# Patient Record
Sex: Male | Born: 1970
Health system: Southern US, Community
[De-identification: ages and names within clinical notes are randomized; demographics above are authoritative.]

## PROBLEM LIST (undated history)

## (undated) DIAGNOSIS — I10 Essential (primary) hypertension: Secondary | ICD-10-CM

## (undated) DIAGNOSIS — E7800 Pure hypercholesterolemia, unspecified: Secondary | ICD-10-CM

## (undated) HISTORY — PX: OTHER SURGICAL HISTORY: SHX169

---

## 2013-12-22 ENCOUNTER — Encounter (HOSPITAL_COMMUNITY): Payer: Self-pay | Admitting: *Deleted

## 2013-12-23 ENCOUNTER — Ambulatory Visit: Payer: Self-pay | Admitting: Orthopedic Surgery

## 2013-12-23 NOTE — H&P (Signed)
Jerome Olson is an 43 y.o. male.   Chief Complaint: back and left leg pain HPI: The patient reports low back symptoms which began 4 week(s) (3 days) ago following a specific injury. 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 Symptoms are reported to be located in the left low back and in the left lumbosacral area and Symptoms include pain and tingling. The pain radiates to the left groin, left buttock and left thigh. Symptoms are exacerbated by sitting and recumbency. Prior to being seen today the patient was previously evaluated by Dr. Park Breed with [facility]. Past evaluation has included x-ray of the lumbar spine and MRI of the lumbar spine. Past treatment has included nonsteroidal anti-inflammatory drugs, opioid analgesics, muscle relaxants, corticosteroids, activity modification and physical therapy. 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 Note for "Back pain": The patient was given light duty restrictions; however he has not been able to work due to no light duty available. NCM is Deliah Goody.  Past Medical History  Diagnosis Date  . Hypertension   . High cholesterol     Past Surgical History  Procedure Laterality Date  . Cyst removed from right knee  90's    No family history on file. Social History:  reports that he has quit smoking. He has never used smokeless tobacco. He reports that he drinks alcohol. He reports that he does not use illicit drugs.  Allergies: No Known Allergies   (Not in a hospital admission)  No results found for this or any previous visit (from the past 48 hour(s)). No results found.  Review of Systems  Constitutional: Negative.   HENT: Negative.   Eyes: Negative.   Respiratory: Negative.   Cardiovascular: Negative.   Gastrointestinal: Negative.   Genitourinary: Negative.   Musculoskeletal: Positive for back pain.  Skin: Negative.   Neurological: Positive for  sensory change and focal weakness.  Psychiatric/Behavioral: Negative.     There were no vitals taken for this visit. Physical Exam  Constitutional: He is oriented to person, place, and time. He appears well-developed and well-nourished. He appears distressed.  HENT:  Head: Normocephalic and atraumatic.  Eyes: Conjunctivae and EOM are normal. Pupils are equal, round, and reactive to light.  Neck: Normal range of motion. Neck supple.  Cardiovascular: Normal rate and regular rhythm.   Respiratory: Effort normal and breath sounds normal.  GI: Soft. Bowel sounds are normal.  Musculoskeletal:  On exam he is in severe distress.  He is sitting and leaning to the right.  Femoral stretch is positive.  Straight leg raise produces buttock and thigh pain.  He has 5-/5 quadriceps and hip flexor weakness on the left.  Decreased sensation L3 dermatome.  Global limited range of motion of the lumbar spine.    Lumbar spine exam reveals no evidence of soft tissue swelling, no evidence of soft tissue swelling or deformity or skin ecchymosis. On palpation there is no tenderness of the lumbar spine. No flank pain with percussion. The abdomen is soft and nontender. Nontender over the trochanters. No cellulitis or lymphadenopathy.   Motor is 5/5 including EHL, tibialis anterior, plantarflexion, and hamstrings. The patient is normoreflexic. There is no Babinski or clonus. The patient has good distal pulses. No DVT. No pain and normal range of motion without instability of the hips, knees and ankles.  Inspection of the cervical spine reveals a normal lordosis without evidence of paraspinous spasms or soft tissue swelling.  Nontender to palpation. Full flexion, full extension, full left and right lateral rotation. Extension combined with lateral flexion does not reproduce pain. Negative impingement sign, negative secondary impingement sign of the shoulders. Negative Tinel's at median and ulnar nerves at the elbow. Negative  carpal compression test at the wrists. Motor of the upper extremities is 5/5 including biceps, triceps, brachioradialis,wrist flexion, wrist extension, finger flexion, finger extension. Reflexes are normoreflexic. Sensory exam is intact to light touch. There is no Hoffmann sign. Nontender over the thoracic spine.  Neurological: He is alert and oriented to person, place, and time. He has normal reflexes.  Skin: Skin is warm and dry.  Psychiatric: He has a normal mood and affect.    Three view radiographs of the lumbar spine demonstrate multilevel lumbar spondylosis, no instability in flexion and extension.  These are outside films.  Hips are unremarkable.    Reviewed outside medical records.  He is almost five weeks status post his injury.  He is at work lifting and bending.    Outside MRI demonstrates large disc herniation at 2-3 central, extruded fragment, multifactorial severe stenosis.    Assessment/Plan L2-3 radiculopathy secondary to large disc herniation at 2-3 sequestered.  No change in bowel or bladder function.  Radicular pain at L3.   I had an extensive discussion with Jerome Olson concerning current pathology, relevant anatomy, and treatment options.  At this point in time given the neurocompressive lesion noted at L2-3 it would be wise to consider decompression at L2-3 centrally.  He does have some symptoms on the right, though given the now dimension of the canal at 2-3 I would recommend decompression centrally.  Discussed the risks and benefits of that in extensive detail.  In the interim I would recommend out of work, protective positioning for bending, avoid extension.  If he has any change in bowel or bladder function, fevers, chills, numbness into his groin or worsening he is to call, present to the emergency room acutely.  I do feel this is related to his injury.  We will try to get approval as soon as possible.  Discussed this separately in front of the patient with Deliah GoodyJennifer Burton  his case manager.  We will proceed with urgency.  We will get the approval for this.  I appreciate the kind referral.    I had an extensive discussion of the risks and benefits of the lumbar decompression with the patient including bleeding, infection, damage to neurovascular structures, epidural fibrosis, CSF leak requiring repair. We also discussed increase in pain, adjacent segment disease, recurrent disc herniation, need for future surgery including repeat decompression and/or fusion. We also discussed risks of postoperative hematoma, paralysis, anesthetic complications including DVT, PE, death, cardiopulmonary dysfunction. In addition, the perioperative and postoperative courses were discussed in detail including the rehabilitative time and return to functional activity and work. I provided the patient with an illustrated handout and utilized the appropriate surgical models.  Plan microlumbar decompression L2-3  BISSELL, JACLYN M. PA-C for Dr. Shelle IronBeane 12/23/2013, 7:09 AM

## 2013-12-25 ENCOUNTER — Ambulatory Visit (HOSPITAL_COMMUNITY): Payer: Worker's Compensation

## 2013-12-25 ENCOUNTER — Ambulatory Visit (HOSPITAL_COMMUNITY): Payer: Worker's Compensation | Admitting: Anesthesiology

## 2013-12-25 ENCOUNTER — Ambulatory Visit (HOSPITAL_COMMUNITY)
Admission: RE | Admit: 2013-12-25 | Discharge: 2013-12-28 | Disposition: A | Discharge status: Home / Self Care | Payer: Worker's Compensation | Source: Ambulatory Visit | Attending: Specialist | Admitting: Specialist

## 2013-12-25 ENCOUNTER — Encounter (HOSPITAL_COMMUNITY)
Admission: RE | Disposition: A | Discharge status: Home / Self Care | Payer: Self-pay | Source: Ambulatory Visit | Attending: Specialist

## 2013-12-25 ENCOUNTER — Encounter (HOSPITAL_COMMUNITY): Payer: Self-pay | Admitting: *Deleted

## 2013-12-25 ENCOUNTER — Encounter (HOSPITAL_COMMUNITY): Payer: Worker's Compensation | Admitting: Anesthesiology

## 2013-12-25 DIAGNOSIS — Y9269 Other specified industrial and construction area as the place of occurrence of the external cause: Secondary | ICD-10-CM | POA: Insufficient documentation

## 2013-12-25 DIAGNOSIS — Y990 Civilian activity done for income or pay: Secondary | ICD-10-CM | POA: Insufficient documentation

## 2013-12-25 DIAGNOSIS — E7800 Pure hypercholesterolemia, unspecified: Secondary | ICD-10-CM | POA: Insufficient documentation

## 2013-12-25 DIAGNOSIS — X500XXA Overexertion from strenuous movement or load, initial encounter: Secondary | ICD-10-CM | POA: Insufficient documentation

## 2013-12-25 DIAGNOSIS — I10 Essential (primary) hypertension: Secondary | ICD-10-CM | POA: Insufficient documentation

## 2013-12-25 DIAGNOSIS — M5126 Other intervertebral disc displacement, lumbar region: Secondary | ICD-10-CM | POA: Diagnosis present

## 2013-12-25 HISTORY — DX: Pure hypercholesterolemia, unspecified: E78.00

## 2013-12-25 HISTORY — DX: Essential (primary) hypertension: I10

## 2013-12-25 HISTORY — PX: LUMBAR LAMINECTOMY/DECOMPRESSION MICRODISCECTOMY: SHX5026

## 2013-12-25 LAB — CBC
HCT: 41.9 % (ref 39.0–52.0)
Hemoglobin: 14.5 g/dL (ref 13.0–17.0)
MCH: 32 pg (ref 26.0–34.0)
MCHC: 34.6 g/dL (ref 30.0–36.0)
MCV: 92.5 fL (ref 78.0–100.0)
Platelets: 255 K/uL (ref 150–400)
RBC: 4.53 MIL/uL (ref 4.22–5.81)
RDW: 12.4 % (ref 11.5–15.5)
WBC: 4.8 K/uL (ref 4.0–10.5)

## 2013-12-25 LAB — BASIC METABOLIC PANEL WITH GFR
Anion gap: 12 (ref 5–15)
BUN: 16 mg/dL (ref 6–23)
CO2: 24 meq/L (ref 19–32)
Calcium: 9.4 mg/dL (ref 8.4–10.5)
Chloride: 100 meq/L (ref 96–112)
Creatinine, Ser: 0.98 mg/dL (ref 0.50–1.35)
GFR calc Af Amer: >90 mL/min
GFR calc non Af Amer: >90 mL/min
Glucose, Bld: 104 mg/dL — ABNORMAL HIGH (ref 70–99)
Potassium: 5.5 meq/L — ABNORMAL HIGH (ref 3.7–5.3)
Sodium: 136 meq/L — ABNORMAL LOW (ref 137–147)

## 2013-12-25 LAB — SURGICAL PCR SCREEN
MRSA, PCR: NEGATIVE
Staphylococcus aureus: NEGATIVE

## 2013-12-25 SURGERY — LUMBAR LAMINECTOMY/DECOMPRESSION MICRODISCECTOMY 1 LEVEL
Anesthesia: General | Site: Back

## 2013-12-25 MED ORDER — MIDAZOLAM HCL 2 MG/2ML IJ SOLN
INTRAMUSCULAR | Status: AC
  Filled 2013-12-25: qty 2

## 2013-12-25 MED ORDER — SODIUM CHLORIDE 0.9 % IR SOLN
Status: DC | PRN
  Administered 2013-12-25: 16:00:00

## 2013-12-25 MED ORDER — SODIUM CHLORIDE 0.9 % IJ SOLN
3.0000 mL | Freq: Two times a day (BID) | INTRAMUSCULAR | Status: DC
  Administered 2013-12-26 – 2013-12-27 (×3): 3 mL via INTRAVENOUS

## 2013-12-25 MED ORDER — MUPIROCIN 2 % EX OINT
TOPICAL_OINTMENT | CUTANEOUS | Status: AC
  Filled 2013-12-25: qty 22

## 2013-12-25 MED ORDER — ONDANSETRON HCL 4 MG/2ML IJ SOLN
INTRAMUSCULAR | Status: AC
  Filled 2013-12-25: qty 2

## 2013-12-25 MED ORDER — GLYCOPYRROLATE 0.2 MG/ML IJ SOLN
INTRAMUSCULAR | Status: AC
  Filled 2013-12-25: qty 4

## 2013-12-25 MED ORDER — METHOCARBAMOL 500 MG PO TABS: 500.0000 mg | ORAL_TABLET | Freq: Three times a day (TID) | ORAL | Status: DC | PRN

## 2013-12-25 MED ORDER — GLYCOPYRROLATE 0.2 MG/ML IJ SOLN
INTRAMUSCULAR | Status: DC | PRN
  Administered 2013-12-25: .8 mg via INTRAVENOUS

## 2013-12-25 MED ORDER — CEFAZOLIN SODIUM-DEXTROSE 2-3 GM-% IV SOLR
2.0000 g | Freq: Three times a day (TID) | INTRAVENOUS | Status: AC
  Administered 2013-12-25 – 2013-12-26 (×3): 2 g via INTRAVENOUS
  Filled 2013-12-25 (×3): qty 50

## 2013-12-25 MED ORDER — FENTANYL CITRATE 0.05 MG/ML IJ SOLN
INTRAMUSCULAR | Status: AC
  Filled 2013-12-25: qty 5

## 2013-12-25 MED ORDER — LACTATED RINGERS IV SOLN
INTRAVENOUS | Status: DC
  Administered 2013-12-25: 1000 mL via INTRAVENOUS

## 2013-12-25 MED ORDER — ONDANSETRON HCL 4 MG/2ML IJ SOLN: 4.0000 mg | INTRAMUSCULAR | Status: DC | PRN

## 2013-12-25 MED ORDER — ONDANSETRON HCL 4 MG/2ML IJ SOLN
INTRAMUSCULAR | Status: DC | PRN
  Administered 2013-12-25: 4 mg via INTRAVENOUS

## 2013-12-25 MED ORDER — HYDROMORPHONE HCL PF 1 MG/ML IJ SOLN: 0.2500 mg | INTRAMUSCULAR | Status: DC | PRN

## 2013-12-25 MED ORDER — DOCUSATE SODIUM 100 MG PO CAPS: 100.0000 mg | ORAL_CAPSULE | Freq: Two times a day (BID) | ORAL | Status: DC | PRN

## 2013-12-25 MED ORDER — LISINOPRIL 20 MG PO TABS
20.0000 mg | ORAL_TABLET | Freq: Every day | ORAL | Status: DC
  Administered 2013-12-26 – 2013-12-28 (×3): 20 mg via ORAL
  Filled 2013-12-25 (×3): qty 1

## 2013-12-25 MED ORDER — NEOSTIGMINE METHYLSULFATE 10 MG/10ML IV SOLN
INTRAVENOUS | Status: DC | PRN
  Administered 2013-12-25: 5 mg via INTRAVENOUS

## 2013-12-25 MED ORDER — HYDROMORPHONE HCL PF 1 MG/ML IJ SOLN
0.5000 mg | INTRAMUSCULAR | Status: DC | PRN
  Administered 2013-12-25 – 2013-12-27 (×9): 1 mg via INTRAVENOUS
  Filled 2013-12-25 (×9): qty 1

## 2013-12-25 MED ORDER — THROMBIN 5000 UNITS EX SOLR
CUTANEOUS | Status: AC
  Filled 2013-12-25: qty 10000

## 2013-12-25 MED ORDER — LACTATED RINGERS IV SOLN
INTRAVENOUS | Status: DC | PRN
  Administered 2013-12-25 (×2): via INTRAVENOUS

## 2013-12-25 MED ORDER — MEPERIDINE HCL 50 MG/ML IJ SOLN: 6.2500 mg | INTRAMUSCULAR | Status: DC | PRN

## 2013-12-25 MED ORDER — SODIUM CHLORIDE 0.9 % IJ SOLN
3.0000 mL | INTRAMUSCULAR | Status: DC | PRN
  Administered 2013-12-27: 3 mL via INTRAVENOUS

## 2013-12-25 MED ORDER — ACETAMINOPHEN 650 MG RE SUPP: 650.0000 mg | RECTAL | Status: DC | PRN

## 2013-12-25 MED ORDER — LISINOPRIL-HYDROCHLOROTHIAZIDE 20-12.5 MG PO TABS: 1.0000 | ORAL_TABLET | Freq: Every day | ORAL | Status: DC

## 2013-12-25 MED ORDER — DOCUSATE SODIUM 100 MG PO CAPS
100.0000 mg | ORAL_CAPSULE | Freq: Two times a day (BID) | ORAL | Status: DC
  Administered 2013-12-25 – 2013-12-28 (×6): 100 mg via ORAL

## 2013-12-25 MED ORDER — MENTHOL 3 MG MT LOZG: 1.0000 | LOZENGE | OROMUCOSAL | Status: DC | PRN

## 2013-12-25 MED ORDER — BUPIVACAINE-EPINEPHRINE (PF) 0.5% -1:200000 IJ SOLN
INTRAMUSCULAR | Status: AC
  Filled 2013-12-25: qty 30

## 2013-12-25 MED ORDER — THROMBIN 5000 UNITS EX SOLR
OROMUCOSAL | Status: DC | PRN
  Administered 2013-12-25: 18:00:00

## 2013-12-25 MED ORDER — ROCURONIUM BROMIDE 100 MG/10ML IV SOLN
INTRAVENOUS | Status: AC
  Filled 2013-12-25: qty 1

## 2013-12-25 MED ORDER — PROMETHAZINE HCL 25 MG/ML IJ SOLN: 6.2500 mg | INTRAMUSCULAR | Status: DC | PRN

## 2013-12-25 MED ORDER — LACTATED RINGERS IV SOLN: INTRAVENOUS | Status: DC

## 2013-12-25 MED ORDER — ACETAMINOPHEN 325 MG PO TABS: 650.0000 mg | ORAL_TABLET | ORAL | Status: DC | PRN

## 2013-12-25 MED ORDER — MIDAZOLAM HCL 5 MG/5ML IJ SOLN
INTRAMUSCULAR | Status: DC | PRN
  Administered 2013-12-25: 2 mg via INTRAVENOUS

## 2013-12-25 MED ORDER — SODIUM CHLORIDE 0.9 % IV SOLN: 250.0000 mL | INTRAVENOUS | Status: DC

## 2013-12-25 MED ORDER — HYDROCHLOROTHIAZIDE 12.5 MG PO CAPS
12.5000 mg | ORAL_CAPSULE | Freq: Every day | ORAL | Status: DC
  Administered 2013-12-26 – 2013-12-28 (×3): 12.5 mg via ORAL
  Filled 2013-12-25 (×3): qty 1

## 2013-12-25 MED ORDER — PROPOFOL 10 MG/ML IV BOLUS
INTRAVENOUS | Status: AC
  Filled 2013-12-25: qty 20

## 2013-12-25 MED ORDER — PROPOFOL 10 MG/ML IV BOLUS
INTRAVENOUS | Status: DC | PRN
  Administered 2013-12-25: 200 mg via INTRAVENOUS

## 2013-12-25 MED ORDER — METHOCARBAMOL 500 MG PO TABS
500.0000 mg | ORAL_TABLET | Freq: Four times a day (QID) | ORAL | Status: DC | PRN
  Administered 2013-12-26 – 2013-12-28 (×8): 500 mg via ORAL
  Filled 2013-12-25 (×8): qty 1

## 2013-12-25 MED ORDER — OXYCODONE-ACETAMINOPHEN 7.5-325 MG PO TABS: 1.0000 | ORAL_TABLET | ORAL | Status: DC | PRN

## 2013-12-25 MED ORDER — CEFAZOLIN SODIUM-DEXTROSE 2-3 GM-% IV SOLR
2.0000 g | INTRAVENOUS | Status: AC
  Administered 2013-12-25: 2 g via INTRAVENOUS

## 2013-12-25 MED ORDER — HEMOSTATIC AGENTS (NO CHARGE) OPTIME
TOPICAL | Status: DC | PRN
  Administered 2013-12-25: 10

## 2013-12-25 MED ORDER — HYDROCODONE-ACETAMINOPHEN 5-325 MG PO TABS
1.0000 | ORAL_TABLET | ORAL | Status: DC | PRN
  Administered 2013-12-25 – 2013-12-26 (×2): 2 via ORAL
  Filled 2013-12-25 (×2): qty 2

## 2013-12-25 MED ORDER — CEFAZOLIN SODIUM-DEXTROSE 2-3 GM-% IV SOLR
INTRAVENOUS | Status: AC
  Filled 2013-12-25: qty 50

## 2013-12-25 MED ORDER — BUPIVACAINE-EPINEPHRINE 0.5% -1:200000 IJ SOLN
INTRAMUSCULAR | Status: DC | PRN
  Administered 2013-12-25: 15 mL

## 2013-12-25 MED ORDER — METHOCARBAMOL 1000 MG/10ML IJ SOLN
500.0000 mg | Freq: Four times a day (QID) | INTRAVENOUS | Status: DC | PRN
  Administered 2013-12-25 – 2013-12-26 (×2): 500 mg via INTRAVENOUS
  Filled 2013-12-25 (×2): qty 5

## 2013-12-25 MED ORDER — KCL IN DEXTROSE-NACL 20-5-0.45 MEQ/L-%-% IV SOLN
INTRAVENOUS | Status: AC
  Administered 2013-12-25: 20:00:00 via INTRAVENOUS
  Filled 2013-12-25 (×2): qty 1000

## 2013-12-25 MED ORDER — OXYCODONE-ACETAMINOPHEN 5-325 MG PO TABS
1.0000 | ORAL_TABLET | ORAL | Status: DC | PRN
  Filled 2013-12-25 (×2): qty 2

## 2013-12-25 MED ORDER — SUCCINYLCHOLINE CHLORIDE 20 MG/ML IJ SOLN
INTRAMUSCULAR | Status: DC | PRN
  Administered 2013-12-25: 100 mg via INTRAVENOUS

## 2013-12-25 MED ORDER — HYDROMORPHONE HCL PF 2 MG/ML IJ SOLN
INTRAMUSCULAR | Status: AC
  Filled 2013-12-25: qty 1

## 2013-12-25 MED ORDER — PHENOL 1.4 % MT LIQD: 1.0000 | OROMUCOSAL | Status: DC | PRN

## 2013-12-25 MED ORDER — HYDROMORPHONE HCL PF 1 MG/ML IJ SOLN
INTRAMUSCULAR | Status: DC | PRN
  Administered 2013-12-25: 1 mg via INTRAVENOUS
  Administered 2013-12-25 (×2): 0.5 mg via INTRAVENOUS

## 2013-12-25 MED ORDER — MUPIROCIN 2 % EX OINT
1.0000 "application " | TOPICAL_OINTMENT | Freq: Once | CUTANEOUS | Status: AC
  Administered 2013-12-25: 1 via TOPICAL

## 2013-12-25 MED ORDER — FENTANYL CITRATE 0.05 MG/ML IJ SOLN
INTRAMUSCULAR | Status: DC | PRN
Start: 2013-12-25 — End: 2013-12-25
  Administered 2013-12-25: 100 ug via INTRAVENOUS
  Administered 2013-12-25: 50 ug via INTRAVENOUS
  Administered 2013-12-25: 100 ug via INTRAVENOUS

## 2013-12-25 MED ORDER — ROCURONIUM BROMIDE 100 MG/10ML IV SOLN
INTRAVENOUS | Status: DC | PRN
  Administered 2013-12-25: 10 mg via INTRAVENOUS
  Administered 2013-12-25: 30 mg via INTRAVENOUS
  Administered 2013-12-25: 10 mg via INTRAVENOUS
  Administered 2013-12-25 (×2): 5 mg via INTRAVENOUS

## 2013-12-25 MED ORDER — NEOSTIGMINE METHYLSULFATE 10 MG/10ML IV SOLN
INTRAVENOUS | Status: AC
  Filled 2013-12-25: qty 1

## 2013-12-25 SURGICAL SUPPLY — 45 items
BAG ZIPLOCK 12X15 (MISCELLANEOUS) IMPLANT
CLEANER TIP ELECTROSURG 2X2 (MISCELLANEOUS) ×2 IMPLANT
CLOTH 2% CHLOROHEXIDINE 3PK (PERSONAL CARE ITEMS) ×2 IMPLANT
DRAPE MICROSCOPE LEICA (MISCELLANEOUS) ×2 IMPLANT
DRAPE POUCH INSTRU U-SHP 10X18 (DRAPES) ×2 IMPLANT
DRAPE SURG 17X11 SM STRL (DRAPES) ×2 IMPLANT
DRAPE UTILITY XL STRL (DRAPES) ×2 IMPLANT
DRSG AQUACEL AG ADV 3.5X 4 (GAUZE/BANDAGES/DRESSINGS) IMPLANT
DRSG AQUACEL AG ADV 3.5X 6 (GAUZE/BANDAGES/DRESSINGS) ×2 IMPLANT
DURAPREP 26ML APPLICATOR (WOUND CARE) ×2 IMPLANT
DURASEAL SPINE SEALANT 3ML (MISCELLANEOUS) IMPLANT
ELECT BLADE TIP CTD 4 INCH (ELECTRODE) IMPLANT
ELECT REM PT RETURN 9FT ADLT (ELECTROSURGICAL) ×2
ELECTRODE REM PT RTRN 9FT ADLT (ELECTROSURGICAL) ×1 IMPLANT
FLOSEAL 10ML (HEMOSTASIS) ×2 IMPLANT
GLOVE BIOGEL PI IND STRL 7.5 (GLOVE) IMPLANT
GLOVE BIOGEL PI INDICATOR 7.5 (GLOVE)
GLOVE SURG SS PI 7.5 STRL IVOR (GLOVE) IMPLANT
GLOVE SURG SS PI 8.0 STRL IVOR (GLOVE) ×4 IMPLANT
GOWN STRL REUS W/TWL XL LVL3 (GOWN DISPOSABLE) ×4 IMPLANT
IV CATH 14GX2 1/4 (CATHETERS) ×2 IMPLANT
KIT BASIN OR (CUSTOM PROCEDURE TRAY) ×2 IMPLANT
KIT POSITIONING SURG ANDREWS (MISCELLANEOUS) ×2 IMPLANT
MANIFOLD NEPTUNE II (INSTRUMENTS) ×2 IMPLANT
NEEDLE SPNL 18GX3.5 QUINCKE PK (NEEDLE) ×4 IMPLANT
PATTIES SURGICAL .5 X.5 (GAUZE/BANDAGES/DRESSINGS) ×2 IMPLANT
PATTIES SURGICAL .75X.75 (GAUZE/BANDAGES/DRESSINGS) IMPLANT
PATTIES SURGICAL 1X1 (DISPOSABLE) IMPLANT
SPONGE LAP 4X18 X RAY DECT (DISPOSABLE) ×2 IMPLANT
SPONGE SURGIFOAM ABS GEL 100 (HEMOSTASIS) ×2 IMPLANT
STAPLER VISISTAT (STAPLE) IMPLANT
STRIP CLOSURE SKIN 1/2X4 (GAUZE/BANDAGES/DRESSINGS) ×2 IMPLANT
SUT NURALON 4 0 TR CR/8 (SUTURE) IMPLANT
SUT PROLENE 3 0 PS 2 (SUTURE) IMPLANT
SUT VIC AB 1 CT1 27 (SUTURE)
SUT VIC AB 1 CT1 27XBRD ANTBC (SUTURE) IMPLANT
SUT VIC AB 1-0 CT2 27 (SUTURE) ×2 IMPLANT
SUT VIC AB 2-0 CT1 27 (SUTURE)
SUT VIC AB 2-0 CT1 TAPERPNT 27 (SUTURE) IMPLANT
SUT VIC AB 2-0 CT2 27 (SUTURE) ×2 IMPLANT
SYRINGE 3CC LL L/F (MISCELLANEOUS) ×2 IMPLANT
TOWEL OR 17X26 10 PK STRL BLUE (TOWEL DISPOSABLE) ×2 IMPLANT
TOWEL OR NON WOVEN STRL DISP B (DISPOSABLE) IMPLANT
TRAY LAMINECTOMY (CUSTOM PROCEDURE TRAY) ×2 IMPLANT
YANKAUER SUCT BULB TIP NO VENT (SUCTIONS) ×2 IMPLANT

## 2013-12-25 NOTE — Anesthesia Preprocedure Evaluation (Addendum)
Anesthesia Evaluation  Patient identified by MRN, date of birth, ID band Patient awake    Reviewed: Allergy & Precautions, H&P , NPO status , Patient's Chart, lab work & pertinent test results  Airway Mallampati: II TM Distance: >3 FB Neck ROM: Full    Dental no notable dental hx. (+) Edentulous Upper   Pulmonary neg pulmonary ROS, former smoker,  breath sounds clear to auscultation  Pulmonary exam normal       Cardiovascular hypertension, Pt. on medications Rhythm:Regular Rate:Normal     Neuro/Psych negative neurological ROS  negative psych ROS   GI/Hepatic negative GI ROS, Neg liver ROS,   Endo/Other  negative endocrine ROS  Renal/GU negative Renal ROS  negative genitourinary   Musculoskeletal negative musculoskeletal ROS (+)   Abdominal   Peds negative pediatric ROS (+)  Hematology negative hematology ROS (+)   Anesthesia Other Findings   Reproductive/Obstetrics negative OB ROS                          Anesthesia Physical Anesthesia Plan  ASA: II  Anesthesia Plan: General   Post-op Pain Management:    Induction: Intravenous  Airway Management Planned: Oral ETT  Additional Equipment:   Intra-op Plan:   Post-operative Plan: Extubation in OR  Informed Consent: I have reviewed the patients History and Physical, chart, labs and discussed the procedure including the risks, benefits and alternatives for the proposed anesthesia with the patient or authorized representative who has indicated his/her understanding and acceptance.   Dental advisory given  Plan Discussed with: CRNA  Anesthesia Plan Comments:         Anesthesia Quick Evaluation

## 2013-12-25 NOTE — Anesthesia Postprocedure Evaluation (Signed)
  Anesthesia Post-op Note  Patient: Jerome MulliganBilly C Ahner  Procedure(s) Performed: Procedure(s) (LRB): MICRO LUMBAR DECOMPRESSION L2-L3 central (N/A)  Patient Location: PACU  Anesthesia Type: General  Level of Consciousness: awake and alert   Airway and Oxygen Therapy: Patient Spontanous Breathing  Post-op Pain: mild  Post-op Assessment: Post-op Vital signs reviewed, Patient's Cardiovascular Status Stable, Respiratory Function Stable, Patent Airway and No signs of Nausea or vomiting  Last Vitals:  Filed Vitals:   12/25/13 1930  BP:   Pulse:   Temp: 36.8 C  Resp:     Post-op Vital Signs: stable   Complications: No apparent anesthesia complications

## 2013-12-25 NOTE — Discharge Instructions (Signed)
Walk As Tolerated utilizing back precautions.  No bending, twisting, or lifting.  No driving for 2 weeks.   °Aquacel dressing may remain in place for 7 days. May shower with aquacel dressing in place. After 7 days, remove aquacel dressing and place gauze and tape dressing which should be kept clean and dry and changed daily. Do not remove steri-strips if they are present. °See Dr. Elodia Haviland in office in 10 to 14 days. Begin taking aspirin 81mg per day starting 4 days after your surgery if not allergic to aspirin or on another blood thinner. °Walk daily even outside. Use a cane or walker only if necessary. °Avoid sitting on soft sofas. ° °

## 2013-12-25 NOTE — H&P (View-Only) (Signed)
Jerome Olson is an 43 y.o. male.   Chief Complaint: back and left leg pain HPI: The patient reports low back symptoms which began 4 week(s) (3 days) ago following a specific injury. 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 Symptoms are reported to be located in the left low back and in the left lumbosacral area and Symptoms include pain and tingling. The pain radiates to the left groin, left buttock and left thigh. Symptoms are exacerbated by sitting and recumbency. Prior to being seen today the patient was previously evaluated by Dr. Park Breed with [facility]. Past evaluation has included x-ray of the lumbar spine and MRI of the lumbar spine. Past treatment has included nonsteroidal anti-inflammatory drugs, opioid analgesics, muscle relaxants, corticosteroids, activity modification and physical therapy. 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 Note for "Back pain": The patient was given light duty restrictions; however he has not been able to work due to no light duty available. NCM is Deliah Goody.  Past Medical History  Diagnosis Date  . Hypertension   . High cholesterol     Past Surgical History  Procedure Laterality Date  . Cyst removed from right knee  90's    No family history on file. Social History:  reports that he has quit smoking. He has never used smokeless tobacco. He reports that he drinks alcohol. He reports that he does not use illicit drugs.  Allergies: No Known Allergies   (Not in a hospital admission)  No results found for this or any previous visit (from the past 48 hour(s)). No results found.  Review of Systems  Constitutional: Negative.   HENT: Negative.   Eyes: Negative.   Respiratory: Negative.   Cardiovascular: Negative.   Gastrointestinal: Negative.   Genitourinary: Negative.   Musculoskeletal: Positive for back pain.  Skin: Negative.   Neurological: Positive for  sensory change and focal weakness.  Psychiatric/Behavioral: Negative.     There were no vitals taken for this visit. Physical Exam  Constitutional: He is oriented to person, place, and time. He appears well-developed and well-nourished. He appears distressed.  HENT:  Head: Normocephalic and atraumatic.  Eyes: Conjunctivae and EOM are normal. Pupils are equal, round, and reactive to light.  Neck: Normal range of motion. Neck supple.  Cardiovascular: Normal rate and regular rhythm.   Respiratory: Effort normal and breath sounds normal.  GI: Soft. Bowel sounds are normal.  Musculoskeletal:  On exam he is in severe distress.  He is sitting and leaning to the right.  Femoral stretch is positive.  Straight leg raise produces buttock and thigh pain.  He has 5-/5 quadriceps and hip flexor weakness on the left.  Decreased sensation L3 dermatome.  Global limited range of motion of the lumbar spine.    Lumbar spine exam reveals no evidence of soft tissue swelling, no evidence of soft tissue swelling or deformity or skin ecchymosis. On palpation there is no tenderness of the lumbar spine. No flank pain with percussion. The abdomen is soft and nontender. Nontender over the trochanters. No cellulitis or lymphadenopathy.   Motor is 5/5 including EHL, tibialis anterior, plantarflexion, and hamstrings. The patient is normoreflexic. There is no Babinski or clonus. The patient has good distal pulses. No DVT. No pain and normal range of motion without instability of the hips, knees and ankles.  Inspection of the cervical spine reveals a normal lordosis without evidence of paraspinous spasms or soft tissue swelling.  Nontender to palpation. Full flexion, full extension, full left and right lateral rotation. Extension combined with lateral flexion does not reproduce pain. Negative impingement sign, negative secondary impingement sign of the shoulders. Negative Tinel's at median and ulnar nerves at the elbow. Negative  carpal compression test at the wrists. Motor of the upper extremities is 5/5 including biceps, triceps, brachioradialis,wrist flexion, wrist extension, finger flexion, finger extension. Reflexes are normoreflexic. Sensory exam is intact to light touch. There is no Hoffmann sign. Nontender over the thoracic spine.  Neurological: He is alert and oriented to person, place, and time. He has normal reflexes.  Skin: Skin is warm and dry.  Psychiatric: He has a normal mood and affect.    Three view radiographs of the lumbar spine demonstrate multilevel lumbar spondylosis, no instability in flexion and extension.  These are outside films.  Hips are unremarkable.    Reviewed outside medical records.  He is almost five weeks status post his injury.  He is at work lifting and bending.    Outside MRI demonstrates large disc herniation at 2-3 central, extruded fragment, multifactorial severe stenosis.    Assessment/Plan L2-3 radiculopathy secondary to large disc herniation at 2-3 sequestered.  No change in bowel or bladder function.  Radicular pain at L3.   I had an extensive discussion with Mr. Jerome Olson concerning current pathology, relevant anatomy, and treatment options.  At this point in time given the neurocompressive lesion noted at L2-3 it would be wise to consider decompression at L2-3 centrally.  He does have some symptoms on the right, though given the now dimension of the canal at 2-3 I would recommend decompression centrally.  Discussed the risks and benefits of that in extensive detail.  In the interim I would recommend out of work, protective positioning for bending, avoid extension.  If he has any change in bowel or bladder function, fevers, chills, numbness into his groin or worsening he is to call, present to the emergency room acutely.  I do feel this is related to his injury.  We will try to get approval as soon as possible.  Discussed this separately in front of the patient with Deliah GoodyJennifer Burton  his case manager.  We will proceed with urgency.  We will get the approval for this.  I appreciate the kind referral.    I had an extensive discussion of the risks and benefits of the lumbar decompression with the patient including bleeding, infection, damage to neurovascular structures, epidural fibrosis, CSF leak requiring repair. We also discussed increase in pain, adjacent segment disease, recurrent disc herniation, need for future surgery including repeat decompression and/or fusion. We also discussed risks of postoperative hematoma, paralysis, anesthetic complications including DVT, PE, death, cardiopulmonary dysfunction. In addition, the perioperative and postoperative courses were discussed in detail including the rehabilitative time and return to functional activity and work. I provided the patient with an illustrated handout and utilized the appropriate surgical models.  Plan microlumbar decompression L2-3  Eliasar Hlavaty M. PA-C for Dr. Shelle IronBeane 12/23/2013, 7:09 AM

## 2013-12-25 NOTE — Progress Notes (Signed)
12/25/13 Nursing 2200   Patient has not voided yet..bladder scanned for 76 cc urine. Pt doen not want i/o cath. Will continue to monitor patient

## 2013-12-25 NOTE — Brief Op Note (Signed)
12/25/2013  6:11 PM  PATIENT:  Jerome MulliganBilly C Olson  43 y.o. male  PRE-OPERATIVE DIAGNOSIS:  HNP/STENOSIS  POST-OPERATIVE DIAGNOSIS:  HNP/STENOSIS  PROCEDURE:  Procedure(s): MICRO LUMBAR DECOMPRESSION L2-L3 central (N/A)  SURGEON:  Surgeon(s) and Role:    * Javier DockerJeffrey C Saydi Kobel, MD - Primary    * Drucilla SchmidtJames P Aplington, MD - Assisting  PHYSICIAN ASSISTANT:   ASSISTANTS: Aplington   ANESTHESIA:   general  EBL:  Total I/O In: 1000 [I.V.:1000] Out: 300 [Blood:300]  BLOOD ADMINISTERED:none  DRAINS: none   LOCAL MEDICATIONS USED:  MARCAINE     SPECIMEN:  Source of Specimen:  L23  DISPOSITION OF SPECIMEN:  PATHOLOGY  COUNTS:  YES  TOURNIQUET:  * No tourniquets in log *  DICTATION: .Other Dictation: Dictation Number    [id number]  PLAN OF CARE: Admit for overnight observation  PATIENT DISPOSITION:  PACU - hemodynamically stable.   Delay start of Pharmacological VTE agent (>24hrs) due to surgical blood loss or risk of bleeding: yes

## 2013-12-25 NOTE — Interval H&P Note (Signed)
History and Physical Interval Note:  12/25/2013 8:17 AM  Jerome Olson  has presented today for surgery, with the diagnosis of HNP/STENOSIS  The various methods of treatment have been discussed with the patient and family. After consideration of risks, benefits and other options for treatment, the patient has consented to  Procedure(s): MICRO LUMBAR DECOMPRESSION L2-3 (N/A) as a surgical intervention .  The patient's history has been reviewed, patient examined, no change in status, stable for surgery.  I have reviewed the patient's chart and labs.  Questions were answered to the patient's satisfaction.     Jelena Malicoat C

## 2013-12-25 NOTE — Transfer of Care (Signed)
Immediate Anesthesia Transfer of Care Note  Patient: Jerome MulliganBilly C Olson  Procedure(s) Performed: Procedure(s) (LRB): MICRO LUMBAR DECOMPRESSION L2-L3 central (N/A)  Patient Location: PACU  Anesthesia Type: General  Level of Consciousness: sedated, patient cooperative and responds to stimulation  Airway & Oxygen Therapy: Patient Spontanous Breathing and Patient connected to face mask oxgen  Post-op Assessment: Report given to PACU RN and Post -op Vital signs reviewed and stable  Post vital signs: Reviewed and stable  Complications: No apparent anesthesia complications

## 2013-12-26 ENCOUNTER — Encounter (HOSPITAL_COMMUNITY): Payer: Self-pay | Admitting: Specialist

## 2013-12-26 LAB — CBC
HCT: 37 % — ABNORMAL LOW (ref 39.0–52.0)
HEMOGLOBIN: 12.3 g/dL — AB (ref 13.0–17.0)
MCH: 31.9 pg (ref 26.0–34.0)
MCHC: 33.2 g/dL (ref 30.0–36.0)
MCV: 95.9 fL (ref 78.0–100.0)
Platelets: 210 10*3/uL (ref 150–400)
RBC: 3.86 MIL/uL — ABNORMAL LOW (ref 4.22–5.81)
RDW: 12.8 % (ref 11.5–15.5)
WBC: 6.3 10*3/uL (ref 4.0–10.5)

## 2013-12-26 LAB — BASIC METABOLIC PANEL
Anion gap: 12 (ref 5–15)
BUN: 12 mg/dL (ref 6–23)
CALCIUM: 8.9 mg/dL (ref 8.4–10.5)
CO2: 26 meq/L (ref 19–32)
Chloride: 102 mEq/L (ref 96–112)
Creatinine, Ser: 1.05 mg/dL (ref 0.50–1.35)
GFR calc Af Amer: >90 mL/min (ref >90)
GFR, EST NON AFRICAN AMERICAN: 84 mL/min — AB (ref >90)
Glucose, Bld: 132 mg/dL — ABNORMAL HIGH (ref 70–99)
Potassium: 4 mEq/L (ref 3.7–5.3)
Sodium: 140 mEq/L (ref 137–147)

## 2013-12-26 MED ORDER — OXYCODONE-ACETAMINOPHEN 5-325 MG PO TABS
1.0000 | ORAL_TABLET | ORAL | Status: DC | PRN
  Administered 2013-12-26 (×2): 2 via ORAL

## 2013-12-26 MED ORDER — HYDROMORPHONE HCL 2 MG PO TABS
2.0000 mg | ORAL_TABLET | ORAL | Status: DC | PRN
Start: 2013-12-26 — End: 2013-12-28
  Administered 2013-12-26 – 2013-12-28 (×11): 4 mg via ORAL
  Filled 2013-12-26 (×11): qty 2

## 2013-12-26 NOTE — Progress Notes (Signed)
Subjective: 1 Day Post-Op Procedure(s) (LRB): MICRO LUMBAR DECOMPRESSION L2-L3 central (N/A) Patient reports pain as 4 on 0-10 scale.   Legs feel better Voiding well. Objective: Vital signs in last 24 hours: Temp:  [97.5 F (36.4 C)-98.8 F (37.1 C)] 97.5 F (36.4 C) (08/18 0209) Pulse Rate:  [73-95] 73 (08/18 0209) Resp:  [16-24] 18 (08/18 0209) BP: (126-161)/(70-99) 126/77 mmHg (08/18 0209) SpO2:  [93 %-100 %] 98 % (08/18 0209) Weight:  [107.23 kg (236 lb 6.4 oz)] 107.23 kg (236 lb 6.4 oz) (08/17 1148)  Intake/Output from previous day: 08/17 0701 - 08/18 0700 In: 3405 [P.O.:480; I.V.:2825; IV Piggyback:100] Out: 1175 [Urine:875; Blood:300] Intake/Output this shift: Total I/O In: 1405 [P.O.:480; I.V.:825; IV Piggyback:100] Out: 875 [Urine:875]   Recent Labs  12/25/13 1218 12/26/13 0510  HGB 14.5 12.3*    Recent Labs  12/25/13 1218 12/26/13 0510  WBC 4.8 6.3  RBC 4.53 3.86*  HCT 41.9 37.0*  PLT 255 210    Recent Labs  12/25/13 1218 12/26/13 0510  NA 136* PENDING  K 5.5* PENDING  CL 100 PENDING  CO2 24 26  BUN 16 12  CREATININE 0.98 1.05  GLUCOSE 104* 132*  CALCIUM 9.4 8.9   No results found for this basename: LABPT, INR,  in the last 72 hours  Neurologically intact Sensation intact distally Dorsiflexion/Plantar flexion intact  Assessment/Plan: 1 Day Post-Op Procedure(s) (LRB): MICRO LUMBAR DECOMPRESSION L2-L3 central (N/A) Advance diet Up with therapy Plan for discharge tomorrow possibly today if does well in PT Discussed OR and prognosis.   Dorna Mallet C 12/26/2013, 6:06 AM

## 2013-12-26 NOTE — Op Note (Signed)
Jerome Olson, Jerome Olson               ACCOUNT NO.:  [id number]  MEDICAL RECORD NO.:  [id number]  LOCATION:  1608                         FACILITY:  [facility]  PHYSICIAN:  Jene Every, M.D.    DATE OF BIRTH:  09/22/1970  DATE OF PROCEDURE: DATE OF DISCHARGE:                              OPERATIVE REPORT   PREOPERATIVE DIAGNOSES:  Herniated nucleus pulposus, spinal stenosis at L2-3.  POSTOPERATIVE DIAGNOSES:  Herniated nucleus pulposus, spinal stenosis at L2-3.  PROCEDURES PERFORMED:  Lumbar decompression at L2-3 with bilateral hemilaminotomy and foraminotomies of L2 and L3.  Microdiskectomy, L2-3 bilaterally.  ANESTHESIA:  General.  ASSISTANT:  Jerome Olson, M.D.  BRIEF HISTORY:  This is a 43 year old with severe lower extremity radicular pain, L2-3 nerve root distribution secondary to large HNP compressing the thecal sac.  He had mild terminal weakness, dermatomal dysesthesias, indicated for lumbar decompression.  Risks and benefits were discussed including bleeding, infection, damage to neurovascular structures, DVT, PE, anesthetic complications, etc.  TECHNIQUE:  With the patient in supine position, after induction of adequate general anesthesia, 2 g Kefzol, placed prone on the Tatums frame.  All bony prominences were well padded.  Lumbar region was prepped and draped in usual sterile fashion.  Two 18-gauge spinal needle was utilized to localize 2-3 interspace, confirmed with x-ray.  Incision was made from above 2 to below 3.  Subcutaneous tissue was dissected. Electrocautery was utilized to achieve hemostasis.  Dorsolumbar fascia was identified and divided in line with the skin incision.  We infiltrated the paraspinous musculature with 0.25% Marcaine with epinephrine.  Paraspinous muscle was elevated from lamina 2 and 3. McCullough retractors were placed.  Confirmatory radiograph obtained. Operating microscope was draped and brought into the surgical field. Due to the  small interlaminar window at 2-3, we decided to proceed centrally and removed the interspinous ligament, and spinous process of 2 as the interlaminar window was beneath the disk space.  I used a 2 and a 3-mm Kerrison to perform hemilaminotomies bilaterally at the caudad edge of 2 preserving the pars.  We detached the ligamentum flavum from the cephalad edge of the 3 utilizing straight curette.  After detached the ligamentum flavum from the caudad edge of 2, we removed the ligamentum flavum centrally from the interspace.  Severe compression of the thecal sac was noted.  I meticulously worked from both sides of the operating field, decompressed the lateral recesses to the medial border of the pedicle.  So, there was no undue tension or compression of the thecal sac.  We performed foraminotomies of L2 and L3 bilaterally, identifying the nerve roots.  There was extensive epidural venous plexus and there was copious venous bleeding throughout the case.  We used hypertensive anesthesias with systolic blood pressure to 100.  We used thrombin-soaked Gelfoam and bone wax to control the epidural bleeding and bleeding from the bone.  We were able to mobilize the thecal sac from the right, gently medially, found a HNP.  We made a small incision in the extruded fragment and copious portion of disk material was removed.  Subligamentous and extruded with a micropituitary.  This was outside the disk space and within the disk space.  There  was no disk herniation within the foramen.  Following removal of the herniated material, we turned our attention to the left side.  The thecal sac was able to be better mobilize.  We performed an annulotomy and copious portion of disk material was removed from the disk space just beneath thecal sac.  Multiple fragments were retrieved, sent to Pathology.  This was after decompressing the lateral recesses to the medial border of the pedicle, just to be able to mobilize the  thecal sac.  Following this, there was good restoration of the thecal sac.  We irrigated the disk space bilaterally with catheter irrigation and also then entered the disk space bilaterally with a micropituitary.  Additional fragments were retrieved.  Irrigated once again.  We checked above the disk space and below the disk space.  We obtained confirmatory radiographs with a marker just above and below the disk space and neural probe passed freely up the foramen at 2 and 3 bilaterally.  Bipolar electrocautery was utilized to achieve hemostasis from an epidural venous plexus bilaterally.  The probe passed freely up the foramen at 2 and 3.  There was no compression upon the sac.  Good restoration.  There was some attenuation of the sac dorsally; however, no active bleeding or CSF leak and copiously irrigated the wound.  I used FloSeal during the case, let it sit for 2-3 minutes and then evacuated that for hemostatic control. I then placed thrombin-soaked Gelfoam in the laminotomy defect without active bleeding and removed the McCullough retractors, it irrigated copiously and the paraspinous tissues.  I then closed the fascia with 1 Vicryl, subcu with 2-0 and skin with staples.  Sterile dressing was applied.  Placed supine on the hospital bed, extubated without difficulty, and transported to the recovery room in satisfactory condition.  The patient tolerated the procedure well.  No complications.  Assistant, Jerome Olson.  Blood loss, 100 mL.     Jene EveryJeffrey Cleave Olson, M.D.     Jerome Olson/MEDQ  D:  12/25/2013  T:  12/26/2013  Job:  [id number]

## 2013-12-26 NOTE — Evaluation (Signed)
Physical Therapy Evaluation Patient Details Name: Jerome Olson MRN: [id number] DOB: February 14, 1971 Today's Date: 12/26/2013   History of Present Illness  Pt isa 43 year old male s/p L2-3 decompression  Clinical Impression  Pt admitted with lumbar decompression. Pt currently with functional limitations due to the deficits listed below (see PT Problem List).  Pt will benefit from skilled PT to increase their independence and safety with mobility to allow discharge to the venue listed below.  Pt with limited mobility due to back pain despite premedication.  Pt will need to practice a couple steps prior to d/c.     Follow Up Recommendations No PT follow up (f/u per f/u MD visit)    Equipment Recommendations  Rolling walker with 5" wheels    Recommendations for Other Services       Precautions / Restrictions Precautions Precautions: Back Restrictions Weight Bearing Restrictions: No      Mobility  Bed Mobility Overal bed mobility: Needs Assistance Bed Mobility: Rolling;Sidelying to Sit Rolling: Min assist Sidelying to sit: Mod assist   Sit to supine: Mod assist   General bed mobility comments: verbal cues for log roll technique, pt assisted slowly due to pain  Transfers Overall transfer level: Needs assistance Equipment used: Rolling walker (2 wheeled) Transfers: Sit to/from Stand Sit to Stand: Min assist;From elevated surface         General transfer comment: assist to rise and control descent, cues for back precautions  Ambulation/Gait Ambulation/Gait assistance: Min assist;+2 safety/equipment (recliner following due to increased pain) Ambulation Distance (Feet): 35 Feet Assistive device: Rolling walker (2 wheeled) Gait Pattern/deviations: Step-to pattern;Step-through pattern;Trunk flexed Gait velocity: decr   General Gait Details: step to pattern in room however with time pt able to progress to step through, verbal cues for straight back  Stairs             Wheelchair Mobility    Modified Rankin (Stroke Patients Only)       Balance                                             Pertinent Vitals/Pain Pain Assessment: 0-10 Pain Score: 9  Pain Location: back Pain Descriptors / Indicators: Constant;Sore Pain Intervention(s): Premedicated before session;Repositioned;Limited activity within patient's tolerance    Home Living Family/patient expects to be discharged to:: Private residence Living Arrangements: Spouse/significant other     Home Access: Stairs to enter Entrance Stairs-Rails: Right Entrance Stairs-Number of Steps: 2 Home Layout: Two level Home Equipment: None      Prior Function Level of Independence: Independent         Comments: pt was bent forward and able to complete adls.  Wife works at [employer] and is on [employer]        Extremity/Trunk Assessment   Upper Extremity Assessment:  (not assessed due to pain.  Can lift arms 30 degrees painfree)           Lower Extremity Assessment: LLE deficits/detail   LLE Deficits / Details: reports presurgical symptoms however states resolved, no numbness or tingling, ankle movement intact     Communication   Communication: No difficulties  Cognition Arousal/Alertness: Awake/alert Behavior During Therapy: WFL for tasks assessed/performed Overall Cognitive Status: Within Functional Limits for tasks assessed  General Comments      Exercises        Assessment/Plan    PT Assessment Patient needs continued PT services  PT Diagnosis Difficulty walking;Acute pain   PT Problem List Decreased mobility;Pain;Decreased knowledge of precautions;Decreased knowledge of use of DME  PT Treatment Interventions Gait training;DME instruction;Stair training;Functional mobility training;Therapeutic activities;Therapeutic exercise;Patient/family education   PT Goals (Current goals can be found in the Care  Plan section) Acute Rehab PT Goals Patient Stated Goal: get leg strength back and get rid of pain PT Goal Formulation: With patient Time For Goal Achievement: 12/30/13 Potential to Achieve Goals: Good    Frequency Min 5X/week   Barriers to discharge        Co-evaluation               End of Session   Activity Tolerance: Patient limited by pain Patient left: in chair;with call bell/phone within reach;with family/visitor present Nurse Communication: Mobility status    Functional Assessment Tool Used: clinical judgement Functional Limitation: Mobility: Walking and moving around Mobility: Walking and Moving Around Current Status ([id number](G8978): At least 40 percent but less than 60 percent impaired, limited or restricted Mobility: Walking and Moving Around Goal Status [phone](G8979): At least 1 percent but less than 20 percent impaired, limited or restricted    Time: [id number] PT Time Calculation (min): 16 min   Charges:   PT Evaluation $Initial PT Evaluation Tier I: 1 Procedure PT Treatments $Gait Training: 8-22 mins   PT G Codes:   Functional Assessment Tool Used: clinical judgement Functional Limitation: Mobility: Walking and moving around    MilfordLEMYRE,KATHrine E 12/26/2013, 2:54 PM Zenovia JarredKati Ishaq Maffei, PT, DPT 12/26/2013 Pager: [phone]

## 2013-12-26 NOTE — Evaluation (Signed)
Occupational Therapy Evaluation Patient Details Name: Jerome MulliganBilly C Blacketer MRN: [id number] DOB: [birth year] Today's Date: 12/26/2013    History of Present Illness pt is s/p L2-3 decompression   Clinical Impression   This 43 year old man was admitted for the above back surgery.  He was limited by pain and will benefit from skilled OT to educate on bathroom transfers and reinforce education.  His wife works at [employer] [place] and is very good about helping him.      Follow Up Recommendations  No OT follow up;Supervision/Assistance - 24 hour    Equipment Recommendations  3 in 1 bedside comode    Recommendations for Other Services       Precautions / Restrictions Precautions Precautions: Back Restrictions Weight Bearing Restrictions: No      Mobility Bed Mobility Overal bed mobility: + 2 for safety/equipment   Rolling: Min assist Sidelying to sit: +2 for safety/equipment;Mod assist   Sit to supine: Mod assist   General bed mobility comments: wife assisted with sidelying to sit.  Assistance for legs and trunk  Transfers Overall transfer level: Needs assistance Equipment used: Rolling walker (2 wheeled) Transfers: Sit to/from Stand Sit to Stand: Mod assist;From elevated surface         General transfer comment: assist to rise and steady.  Cues for back precautions    Balance                                            ADL Overall ADL's : Needs assistance/impaired     Grooming: Moderate assistance;Sitting   Upper Body Bathing: Moderate assistance;Sitting   Lower Body Bathing: Maximal assistance;Sit to/from stand   Upper Body Dressing : Moderate assistance;Sitting   Lower Body Dressing: Total assistance;Sit to/from stand       Toileting- ArchitectClothing Manipulation and Hygiene: Maximal assistance;Sit to/from stand         General ADL Comments: pt limited by pain.  Wife works at [employer] and will help as needed.  She is familiar with AE.      Vision                     Perception     Praxis      Pertinent Vitals/Pain Pain Assessment: 0-10 Pain Score: 8  Pain Location: back; groin Pain Intervention(s): Premedicated before session;Repositioned;Patient requesting pain meds-RN notified     Hand Dominance     Extremity/Trunk Assessment Upper Extremity Assessment Upper Extremity Assessment:  (not assessed due to pain.  Can lift arms 30 degrees painfree)           Communication Communication Communication: No difficulties   Cognition Arousal/Alertness: Awake/alert Behavior During Therapy: WFL for tasks assessed/performed Overall Cognitive Status: Within Functional Limits for tasks assessed                     General Comments       Exercises       Shoulder Instructions      Home Living Family/patient expects to be discharged to:: Private residence Living Arrangements: Spouse/significant other                 Bathroom Shower/Tub: Producer, television/film/videoWalk-in shower   Bathroom Toilet: Standard                Prior Functioning/Environment Level of Independence: Independent  Comments: pt was bent forward and able to complete adls.  Wife works at [employer] and is on [employer]    OT Diagnosis: Generalized weakness   OT Problem List: Pain;Decreased knowledge of use of DME or AE;Decreased knowledge of precautions;Decreased strength;Decreased activity tolerance   OT Treatment/Interventions: Self-care/ADL training;Patient/family education;DME and/or AE instruction    OT Goals(Current goals can be found in the care plan section) Acute Rehab OT Goals Patient Stated Goal: get leg strength back and get rid of pain OT Goal Formulation: With patient Time For Goal Achievement: 01/02/14 Potential to Achieve Goals: Good ADL Goals Pt Will Transfer to Toilet: with min assist;ambulating;bedside commode Pt Will Perform Tub/Shower Transfer: with min assist;Shower transfer;3 in 1 Additional ADL Goal  #1: pt will verbalize 3 back precautions and AE vs assistance  OT Frequency: Min 2X/week   Barriers to D/C:            Co-evaluation              End of Session Nurse Communication: Patient requests pain meds  Activity Tolerance: Patient limited by pain Patient left: in bed;with call bell/phone within reach;with family/visitor present   Time: 1102-1127 OT Time Calculation (min): 25 min Charges:  OT General Charges $OT Visit: 1 Procedure OT Evaluation $Initial OT Evaluation Tier I: 1 Procedure OT Treatments $Therapeutic Activity: 8-22 mins G-Codes: OT G-codes **NOT FOR INPATIENT CLASS** Functional Assessment Tool Used: clinical judgment Functional Limitation: Self care Self Care Current Status (Z6109): At least 80 percent but less than 100 percent impaired, limited or restricted Self Care Goal Status (U0454): At least 20 percent but less than 40 percent impaired, limited or restricted  Alizay Bronkema 12/26/2013, 12:05 PM  Marica Otter, OTR/L [phone] 12/26/2013

## 2013-12-27 MED ORDER — GABAPENTIN 300 MG PO CAPS: 300.0000 mg | ORAL_CAPSULE | Freq: Three times a day (TID) | ORAL | Status: DC

## 2013-12-27 MED ORDER — GABAPENTIN 300 MG PO CAPS
300.0000 mg | ORAL_CAPSULE | Freq: Three times a day (TID) | ORAL | Status: DC
  Administered 2013-12-27 – 2013-12-28 (×4): 300 mg via ORAL
  Filled 2013-12-27 (×6): qty 1

## 2013-12-27 MED ORDER — HYDROMORPHONE HCL 2 MG PO TABS: 2.0000 mg | ORAL_TABLET | ORAL | Status: DC | PRN

## 2013-12-27 MED ORDER — DEXAMETHASONE SODIUM PHOSPHATE 10 MG/ML IJ SOLN
10.0000 mg | Freq: Once | INTRAMUSCULAR | Status: AC
  Administered 2013-12-27: 10 mg via INTRAVENOUS
  Filled 2013-12-27: qty 1

## 2013-12-27 NOTE — Progress Notes (Signed)
A call to Deliah GoodyJennifer Burton [phone], pt's Worker's Comp for DME(RW, 3 in 1commode), no answer, left voice mail.

## 2013-12-27 NOTE — Progress Notes (Signed)
Subjective: 2 Days Post-Op Procedure(s) (LRB): MICRO LUMBAR DECOMPRESSION L2-L3 central (N/A) Patient reports pain as 4 on 0-10 scale.    Objective: Vital signs in last 24 hours: Temp:  [97.7 F (36.5 C)-100.1 F (37.8 C)] 100 F (37.8 C) (08/19 0447) Pulse Rate:  [76-100] 100 (08/19 0447) Resp:  [16-18] 18 (08/19 0447) BP: (110-130)/(68-80) 111/71 mmHg (08/19 0447) SpO2:  [90 %-100 %] 90 % (08/19 0447)  Intake/Output from previous day: 08/18 0701 - 08/19 0700 In: 483 [P.O.:480; I.V.:3] Out: 550 [Urine:550] Intake/Output this shift:     Recent Labs  12/25/13 1218 12/26/13 0510  HGB 14.5 12.3*    Recent Labs  12/25/13 1218 12/26/13 0510  WBC 4.8 6.3  RBC 4.53 3.86*  HCT 41.9 37.0*  PLT 255 210    Recent Labs  12/25/13 1218 12/26/13 0510  NA 136* 140  K 5.5* 4.0  CL 100 102  CO2 24 26  BUN 16 12  CREATININE 0.98 1.05  GLUCOSE 104* 132*  CALCIUM 9.4 8.9   No results found for this basename: LABPT, INR,  in the last 72 hours  Neurologically intact No flatus. Good BS   Assessment/Plan: 2 Days Post-Op Procedure(s) (LRB): MICRO LUMBAR DECOMPRESSION L2-L3 central (N/A) Left leg numbness last PM probable nerve root swelling. Needs flatus - Kpad IV decadron. Neurontin Kpad Up with therapy D/C IV fluids Plan for discharge tomorrow  Quante Pettry C 12/27/2013, 8:32 AM

## 2013-12-27 NOTE — Progress Notes (Signed)
Occupational Therapy Treatment Patient Details Name: Jerome MulliganBilly C Olson MRN: [id number] DOB: Jul 24, 1970 Today's Date: 12/27/2013    History of present illness Pt isa 43 year old male s/p L2-3 decompression   OT comments  Pt is making progress with OT.  Able to tolerate ambulating to bathroom.  Still mod A for bed mobility but less pain today than yesterday  Follow Up Recommendations  No OT follow up;Supervision/Assistance - 24 hour    Equipment Recommendations  3 in 1 bedside comode    Recommendations for Other Services      Precautions / Restrictions Precautions Precautions: Back       Mobility Bed Mobility     Rolling: Min assist Sidelying to sit: Mod assist       General bed mobility comments: vcs for back precautions  Transfers   Equipment used: Rolling walker (2 wheeled) Transfers: Sit to/from Stand Sit to Stand: Min assist;From elevated surface         General transfer comment: assist to rise and steady.  Cues for hand placement    Balance                                   ADL                           Toilet Transfer: Minimal assistance;Ambulation;BSC             General ADL Comments: ambulated to bathroom and sat on 3:1 over commode.  Pt tends to lean forward  a little--hurts to straighten out.  Able to tolerate sitting in chair after using commode.      Vision                     Perception     Praxis      Cognition   Behavior During Therapy: WFL for tasks assessed/performed Overall Cognitive Status: Within Functional Limits for tasks assessed                       Extremity/Trunk Assessment               Exercises     Shoulder Instructions       General Comments      Pertinent Vitals/ Pain       Pain Assessment: No/denies pain Pain Score: 4  (3-5) Pain Location: back Pain Descriptors / Indicators: Aching (one sharp stab) Pain Intervention(s): Limited activity within  patient's tolerance;Repositioned;Utilized relaxation techniques;Premedicated before session  Home Living                                          Prior Functioning/Environment              Frequency Min 2X/week     Progress Toward Goals  OT Goals(current goals can now be found in the care plan section)  Progress towards OT goals: Progressing toward goals     Plan      Co-evaluation                 End of Session     Activity Tolerance Patient tolerated treatment well   Patient Left in chair;with call bell/phone within reach;with family/visitor present   Nurse Communication  Time: 1610-9604 OT Time Calculation (min): 30 min  Charges: OT General Charges $OT Visit: 1 Procedure OT Treatments $Self Care/Home Management : 23-37 mins  Jerome Olson 12/27/2013, 2:02 PM   Marica Otter, OTR/L [phone] 12/27/2013

## 2013-12-27 NOTE — Progress Notes (Signed)
Physical Therapy Treatment Patient Details Name: Jerome MulliganBilly C Stallone MRN: [id number] DOB: April 14, 1971 Today's Date: 12/27/2013    History of Present Illness Pt isa 43 year old male s/p L2-3 decompression    PT Comments    Pt reports pain better today however still present.  Pt able to progress mobility today however wished to attempt practicing steps tomorrow.  Follow Up Recommendations  No PT follow up     Equipment Recommendations  Rolling walker with 5" wheels    Recommendations for Other Services       Precautions / Restrictions Precautions Precautions: Back Precaution Comments: pt able to recall 2/3 back precautions    Mobility  Bed Mobility Overal bed mobility: Needs Assistance Bed Mobility: Sit to Sidelying Rolling: Min assist Sidelying to sit: Mod assist     Sit to sidelying: Mod assist General bed mobility comments: assist for LE managament with reverse log roll technique  Transfers Overall transfer level: Needs assistance Equipment used: Rolling walker (2 wheeled) Transfers: Sit to/from Stand Sit to Stand: Min guard         General transfer comment: increased time to perform due to pain, good hand placement  Ambulation/Gait Ambulation/Gait assistance: Min assist;Min guard;+2 safety/equipment Ambulation Distance (Feet): 240 Feet Assistive device: Rolling walker (2 wheeled) Gait Pattern/deviations: Step-through pattern;Trunk flexed Gait velocity: decr   General Gait Details: verbal cues for posture, 2 instances of L LE buckling, pt reports numbness from last night and this morning is not present   Careers information officertairs            Wheelchair Mobility    Modified Rankin (Stroke Patients Only)       Balance                                    Cognition Arousal/Alertness: Awake/alert Behavior During Therapy: WFL for tasks assessed/performed Overall Cognitive Status: Within Functional Limits for tasks assessed                       Exercises      General Comments        Pertinent Vitals/Pain Pain Assessment: 0-10 Pain Score: 5  Pain Location: back and bil hips during gait Pain Descriptors / Indicators: Sore;Aching Pain Intervention(s): Monitored during session;Repositioned    Home Living                      Prior Function            PT Goals (current goals can now be found in the care plan section) Progress towards PT goals: Progressing toward goals    Frequency  Min 5X/week    PT Plan Current plan remains appropriate    Co-evaluation             End of Session   Activity Tolerance: Patient limited by pain Patient left: with call bell/phone within reach;with family/visitor present;in bed     Time: 9147-82951407-1425 PT Time Calculation (min): 18 min  Charges:  $Gait Training: 8-22 mins                    G Codes:      Venissa Nappi,KATHrine E 12/27/2013, 2:48 PM Zenovia JarredKati Hiba Garry, PT, DPT 12/27/2013 Pager: [phone]

## 2013-12-28 MED ORDER — PREDNISONE (PAK) 5 MG PO TABS: 10.0000 mg | ORAL_TABLET | Freq: Every evening | ORAL | Status: DC

## 2013-12-28 MED ORDER — PREDNISONE (PAK) 5 MG PO TABS
10.0000 mg | ORAL_TABLET | Freq: Every morning | ORAL | Status: AC
  Administered 2013-12-28: 10 mg via ORAL
  Filled 2013-12-28: qty 21

## 2013-12-28 MED ORDER — PREDNISONE (PAK) 5 MG PO TABS: 5.0000 mg | ORAL_TABLET | ORAL | Status: DC

## 2013-12-28 MED ORDER — PREDNISONE (PAK) 5 MG PO TABS: 5.0000 mg | ORAL_TABLET | Freq: Three times a day (TID) | ORAL | Status: DC

## 2013-12-28 MED ORDER — PREDNISONE (PAK) 5 MG PO TABS: 5.0000 mg | ORAL_TABLET | Freq: Four times a day (QID) | ORAL | Status: DC

## 2013-12-28 NOTE — Progress Notes (Signed)
Physical Therapy Treatment Patient Details Name: Jerome Olson MRN: [id number] DOB: 14-Oct-1970 Today's Date: 12/28/2013    History of Present Illness Pt isa 43 year old male s/p L2-3 decompression    PT Comments    Mobility much better today and pt reports pain better as well however still reports some numbness in L LE.  Pt ambulated good distance in hallway and practiced safe stair technique.  Pt feels ready for d/c home today.  Answered pt and spouses' questions and provided back handout.   Follow Up Recommendations  No PT follow up     Equipment Recommendations  Rolling walker with 5" wheels    Recommendations for Other Services       Precautions / Restrictions Precautions Precautions: Back Precaution Comments: provided pt and spouse with handout Restrictions Weight Bearing Restrictions: No    Mobility  Bed Mobility Overal bed mobility: Needs Assistance Bed Mobility: Rolling;Sidelying to Sit Rolling: Supervision Sidelying to sit: Supervision       General bed mobility comments: verbal cues for log roll technique, increased time due to pain however no physical assist required today  Transfers Overall transfer level: Needs assistance Equipment used: Rolling walker (2 wheeled) Transfers: Sit to/from Stand Sit to Stand: Supervision         General transfer comment: verbal cues for back precautions  Ambulation/Gait Ambulation/Gait assistance: Min guard;Supervision Ambulation Distance (Feet): 320 Feet Assistive device: Rolling walker (2 wheeled) Gait Pattern/deviations: Step-through pattern Gait velocity: decr   General Gait Details: improved velocity today however still below normal speed for age, verbal cues for back precautions (esp with walking to stairs, to have spouse assist with RW management so pt does not twist to get RW out of the way)   Stairs Stairs: Yes Stairs assistance: Min guard Stair Management: Step to pattern;Forwards;One rail  Right Number of Stairs: 4 General stair comments: verbal cues for sequence and safety, pt performed again with 2 steps and 2 rails without cues for sequence  Wheelchair Mobility    Modified Rankin (Stroke Patients Only)       Balance                                    Cognition Arousal/Alertness: Awake/alert Behavior During Therapy: WFL for tasks assessed/performed Overall Cognitive Status: Within Functional Limits for tasks assessed                      Exercises      General Comments        Pertinent Vitals/Pain Pain Assessment: 0-10 Pain Score: 4  Pain Location: back Pain Descriptors / Indicators: Aching;Sore Pain Intervention(s): Monitored during session    Home Living                      Prior Function            PT Goals (current goals can now be found in the care plan section) Progress towards PT goals: Progressing toward goals    Frequency  Min 5X/week    PT Plan Current plan remains appropriate    Co-evaluation             End of Session Equipment Utilized During Treatment: Gait belt Activity Tolerance: Patient limited by pain Patient left: with call bell/phone within reach;with family/visitor present;Other (comment) (in bathroom for BM)     Time: 4098-1191 PT Time Calculation (  min): 23 min  Charges:  $Gait Training: 23-37 mins                    G Codes:      Nateisha Moyd,KATHrine E 12/28/2013, 9:26 AM Zenovia JarredKati Adra Shepler, PT, DPT 12/28/2013 Pager: [phone]

## 2013-12-28 NOTE — Progress Notes (Signed)
Subjective: 3 Days Post-Op Procedure(s) (LRB): MICRO LUMBAR DECOMPRESSION L2-L3 central (N/A) Patient reports pain as 4 on 0-10 scale.    Objective: Vital signs in last 24 hours: Temp:  [97.5 F (36.4 C)-98.2 F (36.8 C)] 98.2 F (36.8 C) (08/20 0636) Pulse Rate:  [75-91] 75 (08/20 0636) Resp:  [17-18] 17 (08/20 0636) BP: (113-135)/(69-77) 121/77 mmHg (08/20 0636) SpO2:  [91 %-95 %] 95 % (08/20 0636)  Intake/Output from previous day: 08/19 0701 - 08/20 0700 In: 483 [P.O.:480; I.V.:3] Out: -  Intake/Output this shift:     Recent Labs  12/25/13 1218 12/26/13 0510  HGB 14.5 12.3*    Recent Labs  12/25/13 1218 12/26/13 0510  WBC 4.8 6.3  RBC 4.53 3.86*  HCT 41.9 37.0*  PLT 255 210    Recent Labs  12/25/13 1218 12/26/13 0510  NA 136* 140  K 5.5* 4.0  CL 100 102  CO2 24 26  BUN 16 12  CREATININE 0.98 1.05  GLUCOSE 104* 132*  CALCIUM 9.4 8.9   No results found for this basename: LABPT, INR,  in the last 72 hours  Neurologically intact Sensation intact distally Incision: dressing C/D/I Good flatus Assessment/Plan: 3 Days Post-Op Procedure(s) (LRB): MICRO LUMBAR DECOMPRESSION L2-L3 central (N/A) Much better on neurontin and decadron Discharge home with home health  Arriana Lohmann C 12/28/2013, 7:16 AM

## 2013-12-28 NOTE — Plan of Care (Signed)
Problem: Consults Goal: Diagnosis - Spinal Surgery Outcome: Completed/Met Date Met:  12/28/13 Lumbar Laminectomy (Complex) Decompression and Microdiscectomy of L2-L3  Problem: Discharge Progression Outcomes Goal: Barriers To Progression Addressed/Resolved Outcome: Completed/Met Date Met:  12/28/13 Pain... Manageable on PO narcotics

## 2013-12-28 NOTE — Progress Notes (Signed)
OT Cancellation Note  Patient Details Name: Jerome Olson MRN: [id number] DOB: 1970-09-07   Cancelled Treatment:    Reason Eval/Treat Not Completed: Other (comment).  Wife assisting pt with adls/toileting. She verbalizes comfort with performing shower transfer.  Will sign off.    Liesa Tsan 12/28/2013, 9:17 AM Marica OtterMaryellen Bailyn Spackman, OTR/L [phone] 12/28/2013

## 2013-12-28 NOTE — Progress Notes (Signed)
Pt to d/c home. DME delivered to room at d/c. AVS reviewed and "My Chart" discussed with pt. Pt capable of verbalizing medications, dressing changes, signs and symptoms of infection, and follow-up appointments. Remains hemodynamically stable. No signs and symptoms of distress. Educated pt to return to ER in the case of SOB, dizziness, or chest pain.  

## 2015-03-19 IMAGING — CR DG LUMBAR SPINE 2-3V
2 series · 2 of 2 positions shown · non-contrast
Comparison: 12/08/2013

CLINICAL DATA: Preop for lumbar surgery

EXAM:
LUMBAR SPINE - 2-3 VIEW

[t l-spine a.p.]
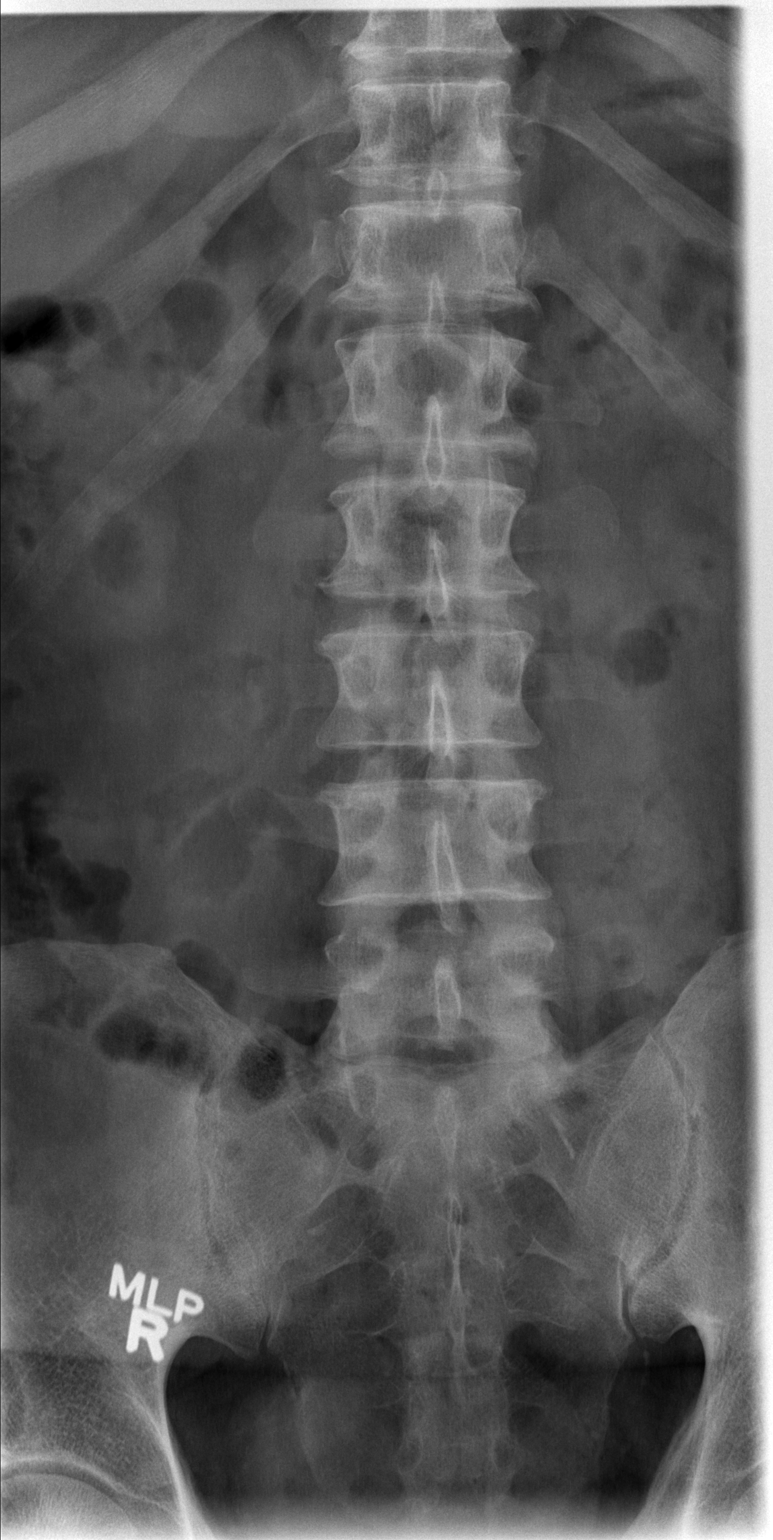

[t l-spine lat]
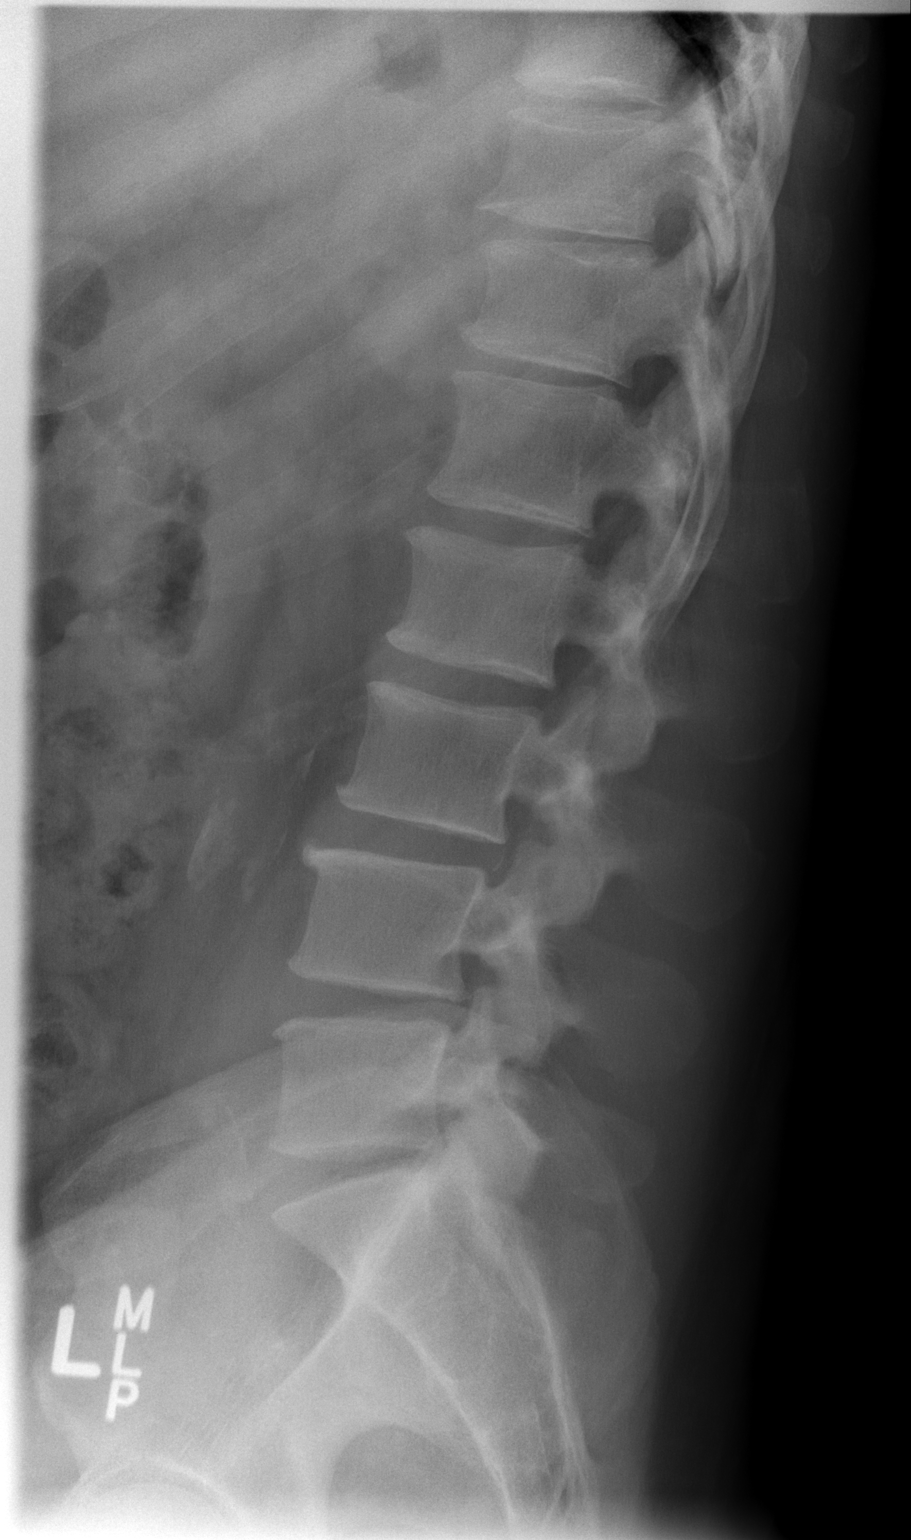

[2 of 2 positions shown; findings below may reference images not displayed]

FINDINGS: Two views of lumbar spine submitted. No acute fracture or
subluxation. Multilevel mild anterior spurring. Mild disc space
flattening at L5-S1 level.
IMPRESSION: No acute fracture or subluxation. Mild disc space flattening at
L5-S1 level.

## 2017-04-22 DIAGNOSIS — E782 Mixed hyperlipidemia: Secondary | ICD-10-CM | POA: Diagnosis not present

## 2017-04-22 DIAGNOSIS — Z6831 Body mass index (BMI) 31.0-31.9, adult: Secondary | ICD-10-CM | POA: Diagnosis not present

## 2017-04-22 DIAGNOSIS — I10 Essential (primary) hypertension: Secondary | ICD-10-CM | POA: Diagnosis not present

## 2017-05-06 DIAGNOSIS — J018 Other acute sinusitis: Secondary | ICD-10-CM | POA: Diagnosis not present

## 2017-09-14 DIAGNOSIS — M25529 Pain in unspecified elbow: Secondary | ICD-10-CM | POA: Diagnosis not present

## 2018-06-09 DIAGNOSIS — S82034A Nondisplaced transverse fracture of right patella, initial encounter for closed fracture: Secondary | ICD-10-CM | POA: Insufficient documentation

## 2019-05-15 DIAGNOSIS — H02412 Mechanical ptosis of left eyelid: Secondary | ICD-10-CM | POA: Diagnosis not present

## 2019-05-23 DIAGNOSIS — H02403 Unspecified ptosis of bilateral eyelids: Secondary | ICD-10-CM | POA: Diagnosis not present

## 2019-06-05 DIAGNOSIS — Z1159 Encounter for screening for other viral diseases: Secondary | ICD-10-CM | POA: Diagnosis not present

## 2019-06-05 DIAGNOSIS — J180 Bronchopneumonia, unspecified organism: Secondary | ICD-10-CM | POA: Diagnosis not present

## 2019-08-08 ENCOUNTER — Encounter: Payer: Self-pay | Admitting: Legal Medicine

## 2019-08-08 ENCOUNTER — Other Ambulatory Visit: Payer: Self-pay

## 2019-08-08 ENCOUNTER — Ambulatory Visit: Payer: BC Managed Care – PPO | Admitting: Legal Medicine

## 2019-08-08 DIAGNOSIS — I10 Essential (primary) hypertension: Secondary | ICD-10-CM

## 2019-08-08 DIAGNOSIS — E782 Mixed hyperlipidemia: Secondary | ICD-10-CM | POA: Diagnosis not present

## 2019-08-08 DIAGNOSIS — R7309 Other abnormal glucose: Secondary | ICD-10-CM | POA: Diagnosis not present

## 2019-08-08 MED ORDER — VALSARTAN-HYDROCHLOROTHIAZIDE 160-12.5 MG PO TABS: 1.0000 | ORAL_TABLET | Freq: Every day | ORAL | 2 refills | Status: DC

## 2019-08-08 NOTE — Assessment & Plan Note (Signed)

## 2019-08-08 NOTE — Assessment & Plan Note (Addendum)
An individual hypertensioncare plan was established and reinforced today.  The patient's status was assessed using clinical findings on exam and labs or diagnostic tests. The patient's success at meeting treatment goals on disease specific evidence-based guidelines and found to be well controlled. SELF MANAGEMENT: The patient and I together assessed ways to personally work towards obtaining the recommended goals. RECOMMENDATIONS: avoid decongestants found in common cold remedies, decrease consumption of alcohol, perform routine monitoring of BP with home BP cuff, exercise, reduction of dietary salt, take medicines as prescribed, try not to miss doses and quit smoking.  Regular exercise and maintaining a healthy weight is needed.  Stress reduction may help. A CLINICAL SUMMARY including written plan identify barriers to care unique to individual due to social or financial issues.  We attempt to mutually creat solutions for individual and family understanding. He is having some cough so changed to valsartan/hctz

## 2019-08-08 NOTE — Progress Notes (Signed)
Established Patient Office Visit  Subjective:  Patient ID: Jerome Olson, male    DOB: 05/11/1971  Age: 49 y.o. MRN: [id number]  CC:  Chief Complaint  Patient presents with  . Hypertension    HPI Jerome Olson presents for Chronic visit  Patient presents for follow up of hypertension.  Patient tolerating lisinopril/HCTZ well with side effects.  Patient was diagnosed with hypertension 2010 so has been treated for hypertension for 10 years.Patient is working on maintaining diet and exercise regimen and follows up as directed. Complication include none  Patient presents with hyperlipidemia.  Compliance with treatment has been good; patient takes medicines as directed, maintains low cholesterol diet, follows up as directed, and maintains exercise regimen.  Patient is using simvastatin without problems..  Past Medical History:  Diagnosis Date  . High cholesterol   . Hypertension     Past Surgical History:  Procedure Laterality Date  . Cyst Removed From Right Knee  90's  . LUMBAR LAMINECTOMY/DECOMPRESSION MICRODISCECTOMY N/A 12/25/2013   Procedure: MICRO LUMBAR DECOMPRESSION L2-L3 central;  Surgeon: Javier Docker, MD;  Location: [facility];  Service: Orthopedics;  Laterality: N/A;    History reviewed. No pertinent family history.  Social History   Socioeconomic History  . Marital status: Married    Spouse name: Not on file  . Number of children: Not on file  . Years of education: Not on file  . Highest education level: Not on file  Occupational History  . Not on file  Tobacco Use  . Smoking status: Former Games developer  . Smokeless tobacco: Never Used  Substance and Sexual Activity  . Alcohol use: Yes    Comment: Beer  . Drug use: No  . Sexual activity: Yes  Other Topics Concern  . Not on file  Social History Narrative  . Not on file   Social Determinants of Health   Financial Resource Strain:   . Difficulty of Paying Living Expenses:   Food Insecurity:   . Worried  About Programme researcher, broadcasting/film/video in the Last Year:   . Barista in the Last Year:   Transportation Needs:   . Freight forwarder (Medical):   Marland Kitchen Lack of Transportation (Non-Medical):   Physical Activity:   . Days of Exercise per Week:   . Minutes of Exercise per Session:   Stress:   . Feeling of Stress :   Social Connections:   . Frequency of Communication with Friends and Family:   . Frequency of Social Gatherings with Friends and Family:   . Attends Religious Services:   . Active Member of Clubs or Organizations:   . Attends Banker Meetings:   Marland Kitchen Marital Status:   Intimate Partner Violence:   . Fear of Current or Ex-Partner:   . Emotionally Abused:   Marland Kitchen Physically Abused:   . Sexually Abused:     Outpatient Medications Prior to Visit  Medication Sig Dispense Refill  . simvastatin (ZOCOR) 40 MG tablet Take 40 mg by mouth every morning.    Marland Kitchen lisinopril-hydrochlorothiazide (PRINZIDE,ZESTORETIC) 20-12.5 MG per tablet Take 1 tablet by mouth daily.    . valsartan-hydrochlorothiazide (DIOVAN-HCT) 160-12.5 MG tablet Take 1 tablet by mouth daily.    Marland Kitchen docusate sodium (COLACE) 100 MG capsule Take 1 capsule (100 mg total) by mouth 2 (two) times daily as needed for mild constipation. 20 capsule 1  . gabapentin (NEURONTIN) 300 MG capsule Take 1 capsule (300 mg total) by mouth 3 (three)  times daily. 60 capsule 2  . HYDROmorphone (DILAUDID) 2 MG tablet Take 1-2 tablets (2-4 mg total) by mouth every 4 (four) hours as needed for severe pain. 60 tablet 0  . methocarbamol (ROBAXIN) 500 MG tablet Take 1 tablet (500 mg total) by mouth every 8 (eight) hours as needed for muscle spasms. 60 tablet 1  . oxyCODONE-acetaminophen (PERCOCET) 7.5-325 MG per tablet Take 1 tablet by mouth every 4 (four) hours as needed for pain. 60 tablet 0  . predniSONE (STERAPRED UNI-PAK) 5 MG TABS tablet Take 1 tablet (5 mg total) by mouth PC lunch. 21 tablet 0   No facility-administered medications prior to  visit.    No Known Allergies  ROS Review of Systems  Constitutional: Negative.   HENT: Negative.   Eyes: Negative.   Cardiovascular: Negative.   Gastrointestinal: Negative.   Endocrine: Negative.   Genitourinary: Negative.   Musculoskeletal: Negative.   Skin: Negative.   Neurological: Negative.   Psychiatric/Behavioral: Negative.       Objective:    Physical Exam  Constitutional: He is oriented to person, place, and time. He appears well-developed and well-nourished.  HENT:  Head: Normocephalic and atraumatic.  Eyes: Pupils are equal, round, and reactive to light. Conjunctivae and EOM are normal.  Cardiovascular: Normal rate, regular rhythm and normal heart sounds.  Pulmonary/Chest: Effort normal and breath sounds normal.  Abdominal: Soft. Bowel sounds are normal.  Musculoskeletal:        General: Normal range of motion.     Cervical back: Normal range of motion and neck supple.  Neurological: He is alert and oriented to person, place, and time.  Skin: Skin is warm and dry.  Psychiatric: He has a normal mood and affect.  Vitals reviewed.   BP 128/86   Pulse 60   Temp (!) 96.4 F (35.8 C)   Resp 16   Ht 5\' 11"  (1.803 m)   Wt 256 lb 3.2 oz (116.2 kg)   BMI 35.73 kg/m  Wt Readings from Last 3 Encounters:  08/08/19 256 lb 3.2 oz (116.2 kg)  12/25/13 236 lb 6.4 oz (107.2 kg)     Health Maintenance Due  Topic Date Due  . HIV Screening  Never done  . TETANUS/TDAP  Never done  . COLONOSCOPY  Never done  . INFLUENZA VACCINE  Never done    There are no preventive care reminders to display for this patient.  No results found for: TSH Lab Results  Component Value Date   WBC 6.3 12/26/2013   HGB 12.3 (L) 12/26/2013   HCT 37.0 (L) 12/26/2013   MCV 95.9 12/26/2013   PLT 210 12/26/2013   Lab Results  Component Value Date   NA 140 12/26/2013   K 4.0 12/26/2013   CO2 26 12/26/2013   GLUCOSE 132 (H) 12/26/2013   BUN 12 12/26/2013   CREATININE 1.05  12/26/2013   CALCIUM 8.9 12/26/2013   ANIONGAP 12 12/26/2013   No results found for: CHOL No results found for: HDL No results found for: LDLCALC No results found for: TRIG No results found for: CHOLHDL No results found for: 12/28/2013    Assessment & Plan:   Problem List Items Addressed This Visit      Cardiovascular and Mediastinum   Essential hypertension, benign    An individual hypertensioncare plan was established and reinforced today.  The patient's status was assessed using clinical findings on exam and labs or diagnostic tests. The patient's success at meeting treatment goals on disease  specific evidence-based guidelines and found to be well controlled. SELF MANAGEMENT: The patient and I together assessed ways to personally work towards obtaining the recommended goals. RECOMMENDATIONS: avoid decongestants found in common cold remedies, decrease consumption of alcohol, perform routine monitoring of BP with home BP cuff, exercise, reduction of dietary salt, take medicines as prescribed, try not to miss doses and quit smoking.  Regular exercise and maintaining a healthy weight is needed.  Stress reduction may help. A CLINICAL SUMMARY including written plan identify barriers to care unique to individual due to social or financial issues.  We attempt to mutually creat solutions for individual and family understanding. He is having some cough so changed to valsartan/hctz      Relevant Medications   valsartan-hydrochlorothiazide (DIOVAN-HCT) 160-12.5 MG tablet   Other Relevant Orders   CBC with Differential   Comprehensive metabolic panel     Other   Mixed hyperlipidemia    AN INDIVIDUAL CARE PLAN for hyperlipidemia/ cholesterol was established and reinforced today.  The patient's status was assessed using clinical findings on exam, lab and other diagnostic tests. The patient's disease status was assessed based on evidence-based guidelines and found to be well controlled. MEDICATIONS  were reviewed. SELF MANAGEMENT GOALS have been discussed and patient's success at attaining the goal of low cholesterol was assessed. RECOMMENDATION given include regular exercise 3 days a week and low cholesterol/low fat diet. CLINICAL SUMMARY including written plan to identify barriers unique to the patient due to social or economic  reasons was discussed.      Relevant Medications   valsartan-hydrochlorothiazide (DIOVAN-HCT) 160-12.5 MG tablet   Other Relevant Orders   Lipid Panel      Meds ordered this encounter  Medications  . valsartan-hydrochlorothiazide (DIOVAN-HCT) 160-12.5 MG tablet    Sig: Take 1 tablet by mouth daily.    Dispense:  90 tablet    Refill:  2    Follow-up: Return in about 6 months (around 02/08/2020) for fasting.    Reinaldo Meeker, MD

## 2019-08-09 ENCOUNTER — Other Ambulatory Visit: Payer: Self-pay

## 2019-08-09 DIAGNOSIS — R7309 Other abnormal glucose: Secondary | ICD-10-CM

## 2019-08-09 LAB — CBC WITH DIFFERENTIAL/PLATELET
Basophils Absolute: 0 10*3/uL (ref 0.0–0.2)
Basos: 1 %
EOS (ABSOLUTE): 0.1 10*3/uL (ref 0.0–0.4)
Eos: 2 %
Hematocrit: 44.2 % (ref 37.5–51.0)
Hemoglobin: 15.1 g/dL (ref 13.0–17.7)
Immature Grans (Abs): 0 10*3/uL (ref 0.0–0.1)
Immature Granulocytes: 0 %
Lymphocytes Absolute: 1.6 10*3/uL (ref 0.7–3.1)
Lymphs: 34 %
MCH: 32.9 pg (ref 26.6–33.0)
MCHC: 34.2 g/dL (ref 31.5–35.7)
MCV: 96 fL (ref 79–97)
Monocytes Absolute: 0.5 10*3/uL (ref 0.1–0.9)
Monocytes: 11 %
Neutrophils Absolute: 2.5 10*3/uL (ref 1.4–7.0)
Neutrophils: 52 %
Platelets: 240 10*3/uL (ref 150–450)
RBC: 4.59 x10E6/uL (ref 4.14–5.80)
RDW: 12.8 % (ref 11.6–15.4)
WBC: 4.8 10*3/uL (ref 3.4–10.8)

## 2019-08-09 LAB — LIPID PANEL
Chol/HDL Ratio: 5 ratio (ref 0.0–5.0)
Cholesterol, Total: 185 mg/dL (ref 100–199)
HDL: 37 mg/dL — ABNORMAL LOW (ref >39)
LDL Chol Calc (NIH): 106 mg/dL — ABNORMAL HIGH (ref 0–99)
Triglycerides: 245 mg/dL — ABNORMAL HIGH (ref 0–149)
VLDL Cholesterol Cal: 42 mg/dL — ABNORMAL HIGH (ref 5–40)

## 2019-08-09 LAB — COMPREHENSIVE METABOLIC PANEL
ALT: 42 IU/L (ref 0–44)
AST: 32 IU/L (ref 0–40)
Albumin/Globulin Ratio: 2 (ref 1.2–2.2)
Albumin: 4.5 g/dL (ref 4.0–5.0)
Alkaline Phosphatase: 78 IU/L (ref 39–117)
BUN/Creatinine Ratio: 17 (ref 9–20)
BUN: 18 mg/dL (ref 6–24)
Bilirubin Total: 0.4 mg/dL (ref 0.0–1.2)
CO2: 23 mmol/L (ref 20–29)
Calcium: 9.5 mg/dL (ref 8.7–10.2)
Chloride: 103 mmol/L (ref 96–106)
Creatinine, Ser: 1.05 mg/dL (ref 0.76–1.27)
GFR calc Af Amer: 95 mL/min/{1.73_m2} (ref >59)
GFR calc non Af Amer: 82 mL/min/{1.73_m2} (ref >59)
Globulin, Total: 2.3 g/dL (ref 1.5–4.5)
Glucose: 118 mg/dL — ABNORMAL HIGH (ref 65–99)
Potassium: 4.7 mmol/L (ref 3.5–5.2)
Sodium: 141 mmol/L (ref 134–144)
Total Protein: 6.8 g/dL (ref 6.0–8.5)

## 2019-08-09 LAB — CARDIOVASCULAR RISK ASSESSMENT

## 2019-08-09 NOTE — Progress Notes (Signed)
No anemia, all CBC normal, glucose 118, kidney tests normal, liver tests normal, , Cholesterol high, triglycerides high , Add A1c, take simvastatin regularly to lower cholesterol lp

## 2019-08-09 NOTE — Progress Notes (Signed)
Add on order. 

## 2019-08-31 LAB — SPECIMEN STATUS REPORT

## 2019-08-31 LAB — HGB A1C W/O EAG: Hgb A1c MFr Bld: 5.7 % — ABNORMAL HIGH (ref 4.8–5.6)

## 2019-09-06 NOTE — Progress Notes (Signed)
A1c 5.7 prediabetes- needs to be on diabetic diet lp

## 2019-09-28 DIAGNOSIS — Z20822 Contact with and (suspected) exposure to covid-19: Secondary | ICD-10-CM | POA: Diagnosis not present

## 2019-10-10 ENCOUNTER — Encounter: Payer: Self-pay | Admitting: Legal Medicine

## 2019-10-10 ENCOUNTER — Telehealth (INDEPENDENT_AMBULATORY_CARE_PROVIDER_SITE_OTHER): Payer: BC Managed Care – PPO | Admitting: Legal Medicine

## 2019-10-10 VITALS — Ht 72.0 in | Wt 246.0 lb

## 2019-10-10 DIAGNOSIS — J0100 Acute maxillary sinusitis, unspecified: Secondary | ICD-10-CM

## 2019-10-10 MED ORDER — AMOXICILLIN-POT CLAVULANATE 875-125 MG PO TABS: 1.0000 | ORAL_TABLET | Freq: Two times a day (BID) | ORAL | 0 refills | Status: DC

## 2019-10-10 NOTE — Progress Notes (Signed)
Virtual Visit via Telephone Note   This visit type was conducted due to national recommendations for restrictions regarding the COVID-19 Pandemic (e.g. social distancing) in an effort to limit this patient's exposure and mitigate transmission in our community.  Due to his co-morbid illnesses, this patient is at least at moderate risk for complications without adequate follow up.  This format is felt to be most appropriate for this patient at this time.  The patient did not have access to video technology/had technical difficulties with video requiring transitioning to audio format only (telephone).  All issues noted in this document were discussed and addressed.  No physical exam could be performed with this format.  Patient verbally consented to a telehealth visit.   Date:  10/10/2019   ID:  Jerome Olson, DOB 1971-02-17, MRN [id number]  Patient Location: Home Provider Location: Office  PCP:  Abigail Miyamoto, MD   Evaluation Performed:  New Patient Evaluation  Chief Complaint:  sinusitis  History of Present Illness:    Jerome Olson is a 49 y.o. male with sinus congestion and cough for 4 days.  no fever or chills.  The patient does not have symptoms concerning for COVID-19 infection (fever, chills, cough, or new shortness of breath).    Past Medical History:  Diagnosis Date  . High cholesterol   . Hypertension     Past Surgical History:  Procedure Laterality Date  . Cyst Removed From Right Knee  90's  . LUMBAR LAMINECTOMY/DECOMPRESSION MICRODISCECTOMY N/A 12/25/2013   Procedure: MICRO LUMBAR DECOMPRESSION L2-L3 central;  Surgeon: Javier Docker, MD;  Location: [facility];  Service: Orthopedics;  Laterality: N/A;    History reviewed. No pertinent family history.  Social History   Socioeconomic History  . Marital status: Married    Spouse name: Not on file  . Number of children: Not on file  . Years of education: Not on file  . Highest education level: Not on file   Occupational History  . Not on file  Tobacco Use  . Smoking status: Former Games developer  . Smokeless tobacco: Never Used  Substance and Sexual Activity  . Alcohol use: Yes    Alcohol/week: 2.0 standard drinks    Types: 2 Cans of beer per week    Comment: 3 times per week  . Drug use: No  . Sexual activity: Yes  Other Topics Concern  . Not on file  Social History Narrative  . Not on file   Social Determinants of Health   Financial Resource Strain:   . Difficulty of Paying Living Expenses:   Food Insecurity:   . Worried About Programme researcher, broadcasting/film/video in the Last Year:   . Barista in the Last Year:   Transportation Needs:   . Freight forwarder (Medical):   Marland Kitchen Lack of Transportation (Non-Medical):   Physical Activity:   . Days of Exercise per Week:   . Minutes of Exercise per Session:   Stress:   . Feeling of Stress :   Social Connections:   . Frequency of Communication with Friends and Family:   . Frequency of Social Gatherings with Friends and Family:   . Attends Religious Services:   . Active Member of Clubs or Organizations:   . Attends Banker Meetings:   Marland Kitchen Marital Status:   Intimate Partner Violence:   . Fear of Current or Ex-Partner:   . Emotionally Abused:   Marland Kitchen Physically Abused:   . Sexually Abused:  Outpatient Medications Prior to Visit  Medication Sig Dispense Refill  . simvastatin (ZOCOR) 40 MG tablet Take 40 mg by mouth every morning.    . valsartan-hydrochlorothiazide (DIOVAN-HCT) 160-12.5 MG tablet Take 1 tablet by mouth daily. 90 tablet 2   No facility-administered medications prior to visit.   .med Allergies:   Patient has no known allergies.   Social History   Tobacco Use  . Smoking status: Former Research scientist (life sciences)  . Smokeless tobacco: Never Used  Substance Use Topics  . Alcohol use: Yes    Alcohol/week: 2.0 standard drinks    Types: 2 Cans of beer per week    Comment: 3 times per week  . Drug use: No     Review of Systems    Constitutional: Negative.   HENT: Positive for congestion and sinus pain.   Eyes: Negative.   Respiratory: Positive for cough.   Cardiovascular: Negative.   Gastrointestinal: Negative.   Genitourinary: Negative.   Musculoskeletal: Negative.   Skin: Negative.      Labs/Other Tests and Data Reviewed:    Recent Labs: 08/08/2019: ALT 42; BUN 18; Creatinine, Ser 1.05; Hemoglobin 15.1; Platelets 240; Potassium 4.7; Sodium 141   Recent Lipid Panel Lab Results  Component Value Date/Time   CHOL 185 08/08/2019 08:22 AM   TRIG 245 (H) 08/08/2019 08:22 AM   HDL 37 (L) 08/08/2019 08:22 AM   CHOLHDL 5.0 08/08/2019 08:22 AM   LDLCALC 106 (H) 08/08/2019 08:22 AM    Wt Readings from Last 3 Encounters:  10/10/19 246 lb (111.6 kg)  08/08/19 256 lb 3.2 oz (116.2 kg)  12/25/13 236 lb 6.4 oz (107.2 kg)     Objective:    Vital Signs:  Ht 6' (1.829 m)   Wt 246 lb (111.6 kg)   BMI 33.36 kg/m    Physical Exam  Vital signs reviewed ASSESSMENT & PLAN:   There are no diagnoses linked to this encounter. Acute sinusitis: Patient is having sinusitis in maxillary area and requires antibiotics.  Augmentin called in.  No orders of the defined types were placed in this encounter.    No orders of the defined types were placed in this encounter.   COVID-19 Education: The signs and symptoms of COVID-19 were discussed with the patient and how to seek care for testing (follow up with PCP or arrange E-visit). The importance of social distancing was discussed today.  Time:   Today, I have spent 20 minutes with the patient with telehealth technology discussing the above problems.    Follow Up:  In Person prn  Signed, Reinaldo Meeker, MD  10/10/2019 3:23 PM    [place]

## 2019-12-23 ENCOUNTER — Other Ambulatory Visit: Payer: Self-pay | Admitting: Legal Medicine

## 2020-02-08 ENCOUNTER — Ambulatory Visit: Payer: BC Managed Care – PPO | Admitting: Legal Medicine

## 2020-02-16 ENCOUNTER — Encounter: Payer: Self-pay | Admitting: Legal Medicine

## 2020-02-16 ENCOUNTER — Other Ambulatory Visit: Payer: Self-pay

## 2020-02-16 ENCOUNTER — Ambulatory Visit: Payer: BC Managed Care – PPO | Admitting: Legal Medicine

## 2020-02-16 VITALS — BP 132/86 | HR 62 | Temp 97.6°F | Ht 72.0 in | Wt 253.0 lb

## 2020-02-16 DIAGNOSIS — Z6834 Body mass index (BMI) 34.0-34.9, adult: Secondary | ICD-10-CM | POA: Insufficient documentation

## 2020-02-16 DIAGNOSIS — I10 Essential (primary) hypertension: Secondary | ICD-10-CM | POA: Diagnosis not present

## 2020-02-16 DIAGNOSIS — E782 Mixed hyperlipidemia: Secondary | ICD-10-CM | POA: Diagnosis not present

## 2020-02-16 DIAGNOSIS — Z6832 Body mass index (BMI) 32.0-32.9, adult: Secondary | ICD-10-CM

## 2020-02-16 MED ORDER — VALSARTAN-HYDROCHLOROTHIAZIDE 160-12.5 MG PO TABS: 0.5000 | ORAL_TABLET | Freq: Every day | ORAL | 2 refills | Status: DC

## 2020-02-16 NOTE — Progress Notes (Signed)
Established Patient Office Visit  Subjective:  Patient ID: Jerome Olson, male    DOB: 04/12/1971  Age: 49 y.o. MRN: [id number]  CC:  Chief Complaint  Patient presents with  . Hyperlipidemia  . Hypertension    HPI Jerome Olson presents for Chronic visit  Patient presents for follow up of hypertension.  Patient tolerating valsatan/hctz 1/2 pill well with side effects.  Patient was diagnosed with hypertension 2010 so has been treated for hypertension for 10 years.Patient is working on maintaining diet and exercise regimen and follows up as directed. Complication include none  Patient presents with hyperlipidemia.  Compliance with treatment has been good; patient takes medicines as directed, maintains low cholesterol diet, follows up as directed, and maintains exercise regimen.  Patient is using simvastatin without problems..  History reviewed. No pertinent past medical history.  Past Surgical History:  Procedure Laterality Date  . Cyst Removed From Right Knee  90's  . LUMBAR LAMINECTOMY/DECOMPRESSION MICRODISCECTOMY N/A 12/25/2013   Procedure: MICRO LUMBAR DECOMPRESSION L2-L3 central;  Surgeon: Javier Docker, MD;  Location: [facility];  Service: Orthopedics;  Laterality: N/A;    History reviewed. No pertinent family history.  Social History   Socioeconomic History  . Marital status: Married    Spouse name: Not on file  . Number of children: Not on file  . Years of education: Not on file  . Highest education level: Not on file  Occupational History  . Not on file  Tobacco Use  . Smoking status: Former Games developer  . Smokeless tobacco: Never Used  Substance and Sexual Activity  . Alcohol use: Yes    Alcohol/week: 2.0 standard drinks    Types: 2 Cans of beer per week    Comment: 3 times per week  . Drug use: No  . Sexual activity: Yes  Other Topics Concern  . Not on file  Social History Narrative  . Not on file   Social Determinants of Health   Financial Resource  Strain:   . Difficulty of Paying Living Expenses: Not on file  Food Insecurity:   . Worried About Programme researcher, broadcasting/film/video in the Last Year: Not on file  . Ran Out of Food in the Last Year: Not on file  Transportation Needs:   . Lack of Transportation (Medical): Not on file  . Lack of Transportation (Non-Medical): Not on file  Physical Activity:   . Days of Exercise per Week: Not on file  . Minutes of Exercise per Session: Not on file  Stress:   . Feeling of Stress : Not on file  Social Connections:   . Frequency of Communication with Friends and Family: Not on file  . Frequency of Social Gatherings with Friends and Family: Not on file  . Attends Religious Services: Not on file  . Active Member of Clubs or Organizations: Not on file  . Attends Banker Meetings: Not on file  . Marital Status: Not on file  Intimate Partner Violence:   . Fear of Current or Ex-Partner: Not on file  . Emotionally Abused: Not on file  . Physically Abused: Not on file  . Sexually Abused: Not on file    Outpatient Medications Prior to Visit  Medication Sig Dispense Refill  . simvastatin (ZOCOR) 40 MG tablet TAKE 1 TABLET BY MOUTH EVERY DAY 30 tablet 6  . amoxicillin-clavulanate (AUGMENTIN) 875-125 MG tablet Take 1 tablet by mouth 2 (two) times daily. 20 tablet 0  . valsartan-hydrochlorothiazide (DIOVAN-HCT) 160-12.5  MG tablet Take 1 tablet by mouth daily. 90 tablet 2   No facility-administered medications prior to visit.    No Known Allergies  ROS Review of Systems  Constitutional: Negative.   HENT: Negative.   Eyes: Negative.   Respiratory: Negative for cough and shortness of breath.   Cardiovascular: Negative.  Negative for chest pain, palpitations and leg swelling.  Gastrointestinal: Negative.   Endocrine: Negative.   Genitourinary: Negative.   Musculoskeletal: Negative.   Skin: Negative.   Neurological: Negative.   Psychiatric/Behavioral: Negative.       Objective:      Physical Exam Vitals reviewed.  Constitutional:      Appearance: Normal appearance. He is well-developed. He is obese.  HENT:     Head: Normocephalic and atraumatic.     Right Ear: Tympanic membrane, ear canal and external ear normal.     Left Ear: Tympanic membrane, ear canal and external ear normal.     Nose: Nose normal.     Mouth/Throat:     Mouth: Mucous membranes are moist.     Pharynx: Oropharynx is clear.  Eyes:     Extraocular Movements: Extraocular movements intact.     Conjunctiva/sclera: Conjunctivae normal.     Pupils: Pupils are equal, round, and reactive to light.  Cardiovascular:     Rate and Rhythm: Normal rate and regular rhythm.     Pulses: Normal pulses.     Heart sounds: Normal heart sounds.  Pulmonary:     Effort: Pulmonary effort is normal.     Breath sounds: Normal breath sounds.  Abdominal:     General: Abdomen is flat. Bowel sounds are normal.     Palpations: Abdomen is soft.  Musculoskeletal:        General: Normal range of motion.     Cervical back: Normal range of motion and neck supple.  Skin:    General: Skin is warm and dry.     Capillary Refill: Capillary refill takes less than 2 seconds.  Neurological:     General: No focal deficit present.     Mental Status: He is alert and oriented to person, place, and time. Mental status is at baseline.  Psychiatric:        Mood and Affect: Mood normal.        Thought Content: Thought content normal.        Judgment: Judgment normal.     BP 132/86   Pulse 62   Temp 97.6 F (36.4 C)   Ht 6' (1.829 m)   Wt 253 lb (114.8 kg)   SpO2 99%   BMI 34.31 kg/m  Wt Readings from Last 3 Encounters:  02/16/20 253 lb (114.8 kg)  10/10/19 246 lb (111.6 kg)  08/08/19 256 lb 3.2 oz (116.2 kg)     Health Maintenance Due  Topic Date Due  . Hepatitis C Screening  Never done  . HIV Screening  Never done  . TETANUS/TDAP  Never done  . COLONOSCOPY  Never done    There are no preventive care reminders  to display for this patient.  No results found for: TSH Lab Results  Component Value Date   WBC 5.5 02/16/2020   HGB 15.8 02/16/2020   HCT 46.0 02/16/2020   MCV 94 02/16/2020   PLT 211 02/16/2020   Lab Results  Component Value Date   NA 140 02/16/2020   K 4.6 02/16/2020   CO2 24 02/16/2020   GLUCOSE 110 (H) 02/16/2020   BUN  17 02/16/2020   CREATININE 0.97 02/16/2020   BILITOT 0.4 02/16/2020   ALKPHOS 86 02/16/2020   AST 29 02/16/2020   ALT 39 02/16/2020   PROT 6.9 02/16/2020   ALBUMIN 4.6 02/16/2020   CALCIUM 9.6 02/16/2020   ANIONGAP 12 12/26/2013   Lab Results  Component Value Date   CHOL 217 (H) 02/16/2020   Lab Results  Component Value Date   HDL 37 (L) 02/16/2020   Lab Results  Component Value Date   LDLCALC 109 (H) 02/16/2020   Lab Results  Component Value Date   TRIG 412 (H) 02/16/2020   Lab Results  Component Value Date   CHOLHDL 5.9 (H) 02/16/2020   Lab Results  Component Value Date   HGBA1C 5.7 (H) 08/08/2019      Assessment & Plan:   Problem List Items Addressed This Visit      Cardiovascular and Mediastinum   Essential hypertension, benign   Relevant Medications   valsartan-hydrochlorothiazide (DIOVAN-HCT) 160-12.5 MG tablet   Other Relevant Orders   CBC with Differential/Platelet (Completed)   Comprehensive metabolic panel (Completed) An individual hypertension care plan was established and reinforced today.  The patient's status was assessed using clinical findings on exam and labs or diagnostic tests. The patient's success at meeting treatment goals on disease specific evidence-based guidelines and found to be well controlled. SELF MANAGEMENT: The patient and I together assessed ways to personally work towards obtaining the recommended goals. RECOMMENDATIONS: avoid decongestants found in common cold remedies, decrease consumption of alcohol, perform routine monitoring of BP with home BP cuff, exercise, reduction of dietary salt, take  medicines as prescribed, try not to miss doses and quit smoking.  Regular exercise and maintaining a healthy weight is needed.  Stress reduction may help. A CLINICAL SUMMARY including written plan identify barriers to care unique to individual due to social or financial issues.  We attempt to mutually creat solutions for individual and family understanding.     Other   Mixed hyperlipidemia - Primary   Relevant Medications   valsartan-hydrochlorothiazide (DIOVAN-HCT) 160-12.5 MG tablet   Other Relevant Orders   Lipid panel (Completed) AN INDIVIDUAL CARE PLAN for hyperlipidemia/ cholesterol was established and reinforced today.  The patient's status was assessed using clinical findings on exam, lab and other diagnostic tests. The patient's disease status was assessed based on evidence-based guidelines and found to be well controlled. MEDICATIONS were reviewed. SELF MANAGEMENT GOALS have been discussed and patient's success at attaining the goal of low cholesterol was assessed. RECOMMENDATION given include regular exercise 3 days a week and low cholesterol/low fat diet. CLINICAL SUMMARY including written plan to identify barriers unique to the patient due to social or economic  reasons was discussed.    BMI 32.0-32.9,adult An individualize plan was formulated for obesity using patient history and physical exam to encourage weight loss.  An evidence based program was formulated.  Patient is to cut portion size with meals and to plan physical exercise 3 days a week at least 20 minutes.  Weight watchers and other programs are helpful.  Planned amount of weight loss 10 lbs.      Meds ordered this encounter  Medications  . valsartan-hydrochlorothiazide (DIOVAN-HCT) 160-12.5 MG tablet    Sig: Take 0.5 tablets by mouth daily.    Dispense:  90 tablet    Refill:  2    Follow-up: Return in about 6 months (around 08/16/2020) for fasting.    Brent Bulla, MD

## 2020-02-17 LAB — LIPID PANEL
Chol/HDL Ratio: 5.9 ratio — ABNORMAL HIGH (ref 0.0–5.0)
Cholesterol, Total: 217 mg/dL — ABNORMAL HIGH (ref 100–199)
HDL: 37 mg/dL — ABNORMAL LOW (ref >39)
LDL Chol Calc (NIH): 109 mg/dL — ABNORMAL HIGH (ref 0–99)
Triglycerides: 412 mg/dL — ABNORMAL HIGH (ref 0–149)
VLDL Cholesterol Cal: 71 mg/dL — ABNORMAL HIGH (ref 5–40)

## 2020-02-17 LAB — COMPREHENSIVE METABOLIC PANEL
ALT: 39 IU/L (ref 0–44)
AST: 29 IU/L (ref 0–40)
Albumin/Globulin Ratio: 2 (ref 1.2–2.2)
Albumin: 4.6 g/dL (ref 3.8–4.9)
Alkaline Phosphatase: 86 IU/L (ref 44–121)
BUN/Creatinine Ratio: 18 (ref 9–20)
BUN: 17 mg/dL (ref 6–24)
Bilirubin Total: 0.4 mg/dL (ref 0.0–1.2)
CO2: 24 mmol/L (ref 20–29)
Calcium: 9.6 mg/dL (ref 8.7–10.2)
Chloride: 102 mmol/L (ref 96–106)
Creatinine, Ser: 0.97 mg/dL (ref 0.76–1.27)
GFR calc Af Amer: 104 mL/min/{1.73_m2} (ref >59)
GFR calc non Af Amer: 90 mL/min/{1.73_m2} (ref >59)
Globulin, Total: 2.3 g/dL (ref 1.5–4.5)
Glucose: 110 mg/dL — ABNORMAL HIGH (ref 65–99)
Potassium: 4.6 mmol/L (ref 3.5–5.2)
Sodium: 140 mmol/L (ref 134–144)
Total Protein: 6.9 g/dL (ref 6.0–8.5)

## 2020-02-17 LAB — CBC WITH DIFFERENTIAL/PLATELET
Basophils Absolute: 0.1 10*3/uL (ref 0.0–0.2)
Basos: 1 %
EOS (ABSOLUTE): 0.1 10*3/uL (ref 0.0–0.4)
Eos: 2 %
Hematocrit: 46 % (ref 37.5–51.0)
Hemoglobin: 15.8 g/dL (ref 13.0–17.7)
Immature Grans (Abs): 0 10*3/uL (ref 0.0–0.1)
Immature Granulocytes: 1 %
Lymphocytes Absolute: 1.8 10*3/uL (ref 0.7–3.1)
Lymphs: 33 %
MCH: 32.1 pg (ref 26.6–33.0)
MCHC: 34.3 g/dL (ref 31.5–35.7)
MCV: 94 fL (ref 79–97)
Monocytes Absolute: 0.4 10*3/uL (ref 0.1–0.9)
Monocytes: 7 %
Neutrophils Absolute: 3.1 10*3/uL (ref 1.4–7.0)
Neutrophils: 56 %
Platelets: 211 10*3/uL (ref 150–450)
RBC: 4.92 x10E6/uL (ref 4.14–5.80)
RDW: 12.6 % (ref 11.6–15.4)
WBC: 5.5 10*3/uL (ref 3.4–10.8)

## 2020-02-17 LAB — CARDIOVASCULAR RISK ASSESSMENT

## 2020-02-18 ENCOUNTER — Encounter: Payer: Self-pay | Admitting: Legal Medicine

## 2020-02-18 NOTE — Progress Notes (Signed)
CBC normal, Glucose 110, kidney and liver tests normal, triglycerides high 412, LDL-c high at 109, want triglycerides< 149 and LDL < 100- I recommend atorvastatin 80mg  qd,  lp

## 2020-02-21 ENCOUNTER — Other Ambulatory Visit: Payer: Self-pay | Admitting: Legal Medicine

## 2020-02-21 ENCOUNTER — Telehealth: Payer: Self-pay

## 2020-02-21 DIAGNOSIS — I10 Essential (primary) hypertension: Secondary | ICD-10-CM

## 2020-02-21 MED ORDER — VALSARTAN-HYDROCHLOROTHIAZIDE 80-12.5 MG PO TABS: 1.0000 | ORAL_TABLET | Freq: Every day | ORAL | 2 refills | Status: DC

## 2020-02-21 NOTE — Telephone Encounter (Signed)
Patient's wife called asking for a new prescription or new dose for blood pressure medication (Diovan) because they cannot cut 1/2 tablet. She said it is a mess and also pharmacy tried to cut them and it was hard. Can you give your advice?

## 2020-02-22 NOTE — Telephone Encounter (Signed)
Patient was informed that Dr Marina Goodell sent a new prescription to the pharmacy.

## 2020-06-03 ENCOUNTER — Telehealth (INDEPENDENT_AMBULATORY_CARE_PROVIDER_SITE_OTHER): Payer: No Typology Code available for payment source | Admitting: Legal Medicine

## 2020-06-03 ENCOUNTER — Encounter: Payer: Self-pay | Admitting: Legal Medicine

## 2020-06-03 DIAGNOSIS — Z9189 Other specified personal risk factors, not elsewhere classified: Secondary | ICD-10-CM | POA: Diagnosis not present

## 2020-06-03 DIAGNOSIS — J329 Chronic sinusitis, unspecified: Secondary | ICD-10-CM | POA: Insufficient documentation

## 2020-06-03 DIAGNOSIS — U071 COVID-19: Secondary | ICD-10-CM | POA: Diagnosis not present

## 2020-06-03 DIAGNOSIS — J019 Acute sinusitis, unspecified: Secondary | ICD-10-CM

## 2020-06-03 LAB — POC COVID19 BINAXNOW: SARS Coronavirus 2 Ag: POSITIVE — AB

## 2020-06-03 MED ORDER — AZITHROMYCIN 250 MG PO TABS: ORAL_TABLET | ORAL | 0 refills | Status: DC

## 2020-06-03 MED ORDER — PREDNISONE 10 MG (21) PO TBPK: ORAL_TABLET | ORAL | 0 refills | Status: DC

## 2020-06-03 NOTE — Progress Notes (Signed)
Covid positive, I sent and antibiotic already lp

## 2020-06-03 NOTE — Progress Notes (Signed)
Virtual Visit via Telephone Note   This visit type was conducted due to national recommendations for restrictions regarding the COVID-19 Pandemic (e.g. social distancing) in an effort to limit this patient's exposure and mitigate transmission in our community.  Due to his co-morbid illnesses, this patient is at least at moderate risk for complications without adequate follow up.  This format is felt to be most appropriate for this patient at this time.  The patient did not have access to video technology/had technical difficulties with video requiring transitioning to audio format only (telephone).  All issues noted in this document were discussed and addressed.  No physical exam could be performed with this format.  Patient verbally consented to a telehealth visit.   Date:  06/03/2020   ID:  Jerome Olson, DOB 06/21/1970, MRN [id number]  Patient Location: Home Provider Location: Office/Clinic  PCP:  Abigail Miyamoto, MD   Evaluation Performed:  New Patient Evaluation  Chief Complaint:  1 week with myalgias, fever, sore throat  History of Present Illness:    Jerome Olson is a 50 y.o. male with 1 week with myalgias, fever, sore throat, using mucinex, fatigue   The patient does have symptoms concerning for COVID-19 infection (fever, chills, cough, or new shortness of breath).    History reviewed. No pertinent past medical history.  Past Surgical History:  Procedure Laterality Date  . Cyst Removed From Right Knee  90's  . LUMBAR LAMINECTOMY/DECOMPRESSION MICRODISCECTOMY N/A 12/25/2013   Procedure: MICRO LUMBAR DECOMPRESSION L2-L3 central;  Surgeon: Javier Docker, MD;  Location: [facility];  Service: Orthopedics;  Laterality: N/A;    History reviewed. No pertinent family history.  Social History   Socioeconomic History  . Marital status: Married    Spouse name: Not on file  . Number of children: Not on file  . Years of education: Not on file  . Highest education  level: Not on file  Occupational History  . Not on file  Tobacco Use  . Smoking status: Former Games developer  . Smokeless tobacco: Never Used  Substance and Sexual Activity  . Alcohol use: Yes    Alcohol/week: 2.0 standard drinks    Types: 2 Cans of beer per week    Comment: 3 times per week  . Drug use: No  . Sexual activity: Yes  Other Topics Concern  . Not on file  Social History Narrative  . Not on file   Social Determinants of Health   Financial Resource Strain: Not on file  Food Insecurity: Not on file  Transportation Needs: Not on file  Physical Activity: Not on file  Stress: Not on file  Social Connections: Not on file  Intimate Partner Violence: Not on file    Outpatient Medications Prior to Visit  Medication Sig Dispense Refill  . simvastatin (ZOCOR) 40 MG tablet TAKE 1 TABLET BY MOUTH EVERY DAY 30 tablet 6  . valsartan-hydrochlorothiazide (DIOVAN HCT) 80-12.5 MG tablet Take 1 tablet by mouth daily. 90 tablet 2  . valsartan-hydrochlorothiazide (DIOVAN-HCT) 160-12.5 MG tablet Take 0.5 tablets by mouth daily. 90 tablet 2   No facility-administered medications prior to visit.    Allergies:   Patient has no known allergies.   Social History   Tobacco Use  . Smoking status: Former Games developer  . Smokeless tobacco: Never Used  Substance Use Topics  . Alcohol use: Yes    Alcohol/week: 2.0 standard drinks    Types: 2 Cans of beer per week    Comment:  3 times per week  . Drug use: No     Review of Systems  Constitutional: Negative for chills and fever.  HENT: Positive for congestion. Negative for hearing loss and tinnitus.   Eyes: Negative for redness.  Respiratory: Positive for cough.   Cardiovascular: Negative for chest pain and palpitations.  Gastrointestinal: Negative for melena.  Genitourinary: Negative for dysuria.  Musculoskeletal: Positive for myalgias.  Neurological: Positive for headaches.  Psychiatric/Behavioral: Negative.      Labs/Other Tests and  Data Reviewed:    Recent Labs: 02/16/2020: ALT 39; BUN 17; Creatinine, Ser 0.97; Hemoglobin 15.8; Platelets 211; Potassium 4.6; Sodium 140   Recent Lipid Panel Lab Results  Component Value Date/Time   CHOL 217 (H) 02/16/2020 07:59 AM   TRIG 412 (H) 02/16/2020 07:59 AM   HDL 37 (L) 02/16/2020 07:59 AM   CHOLHDL 5.9 (H) 02/16/2020 07:59 AM   LDLCALC 109 (H) 02/16/2020 07:59 AM    Wt Readings from Last 3 Encounters:  06/03/20 250 lb (113.4 kg)  02/16/20 253 lb (114.8 kg)  10/10/19 246 lb (111.6 kg)     Objective:    Vital Signs:  BP 121/80   Pulse 73   Ht 6' (1.829 m)   Wt 250 lb (113.4 kg)   SpO2 94%   BMI 33.91 kg/m    Physical Exam vs stable  ASSESSMENT & PLAN:   Diagnoses and all orders for this visit: At increased risk of exposure to COVID-19 virus -     POC COVID-19 Positive Covid, He was educated and will quarentene  Acute non-recurrent sinusitis, unspecified location Other orders -     azithromycin (ZITHROMAX) 250 MG tablet; 2 tablets on day 1, then 1 tablet daily on days 2-6. -     predniSONE (STERAPRED UNI-PAK 21 TAB) 10 MG (21) TBPK tablet; Take 6ills first day , then 5 pills day 2 and then cut down one pill day until gone        COVID-19 Education: The signs and symptoms of COVID-19 were discussed with the patient and how to seek care for testing (follow up with PCP or arrange E-visit). The importance of social distancing was discussed today.   I spent 15 minutes dedicated to the care of this patient on the date of this encounter to include face-to-face time with the patient, as well as:   Follow Up:  In Person prn  Signed,  Brent Bulla, MD  06/03/2020 11:22 AM    [facility]

## 2020-08-19 ENCOUNTER — Encounter: Payer: Self-pay | Admitting: Legal Medicine

## 2020-08-19 ENCOUNTER — Ambulatory Visit: Payer: No Typology Code available for payment source | Admitting: Legal Medicine

## 2020-08-19 ENCOUNTER — Other Ambulatory Visit: Payer: Self-pay

## 2020-08-19 VITALS — BP 120/78 | HR 60 | Temp 98.2°F | Resp 16 | Ht 72.0 in | Wt 250.0 lb

## 2020-08-19 DIAGNOSIS — I10 Essential (primary) hypertension: Secondary | ICD-10-CM | POA: Diagnosis not present

## 2020-08-19 DIAGNOSIS — E782 Mixed hyperlipidemia: Secondary | ICD-10-CM | POA: Diagnosis not present

## 2020-08-19 DIAGNOSIS — Z6832 Body mass index (BMI) 32.0-32.9, adult: Secondary | ICD-10-CM

## 2020-08-19 NOTE — Progress Notes (Signed)
Subjective:  Patient ID: Jerome Olson, male    DOB: 04/19/1971  Age: 50 y.o. MRN: [id number]  Chief Complaint  Patient presents with  . Hyperlipidemia    HPI: chronic visit  Patient presents with hyperlipidemia.  Compliance with treatment has been good; patient takes medicines as directed, maintains low cholesterol diet, follows up as directed, and maintains exercise regimen.  Patient is using simvastatin without problems.  Patient presents for follow up of hypertension.  Patient tolerating valsaratan/HCTZ well with side effects.  Patient was diagnosed with hypertension 2010 so has been treated for hypertension for 102 years.Patient is working on maintaining diet and exercise regimen and follows up as directed. Complication include none.    Current Outpatient Medications on File Prior to Visit  Medication Sig Dispense Refill  . simvastatin (ZOCOR) 40 MG tablet TAKE 1 TABLET BY MOUTH EVERY DAY 30 tablet 6  . valsartan-hydrochlorothiazide (DIOVAN HCT) 80-12.5 MG tablet Take 1 tablet by mouth daily. 90 tablet 2   No current facility-administered medications on file prior to visit.   History reviewed. No pertinent past medical history. Past Surgical History:  Procedure Laterality Date  . Cyst Removed From Right Knee  90's  . LUMBAR LAMINECTOMY/DECOMPRESSION MICRODISCECTOMY N/A 12/25/2013   Procedure: MICRO LUMBAR DECOMPRESSION L2-L3 central;  Surgeon: Javier Docker, MD;  Location: [facility];  Service: Orthopedics;  Laterality: N/A;    History reviewed. No pertinent family history. Social History   Socioeconomic History  . Marital status: Married    Spouse name: Not on file  . Number of children: Not on file  . Years of education: Not on file  . Highest education level: Not on file  Occupational History  . Not on file  Tobacco Use  . Smoking status: Former Games developer  . Smokeless tobacco: Never Used  Substance and Sexual Activity  . Alcohol use: Yes    Alcohol/week: 2.0  standard drinks    Types: 2 Cans of beer per week    Comment: 3 times per week  . Drug use: No  . Sexual activity: Yes  Other Topics Concern  . Not on file  Social History Narrative  . Not on file   Social Determinants of Health   Financial Resource Strain: Not on file  Food Insecurity: Not on file  Transportation Needs: Not on file  Physical Activity: Not on file  Stress: Not on file  Social Connections: Not on file    Review of Systems  Constitutional: Negative for appetite change.  HENT: Negative for congestion.   Eyes: Negative for visual disturbance.  Respiratory: Negative for chest tightness and shortness of breath.   Cardiovascular: Negative for chest pain, palpitations and leg swelling.  Gastrointestinal: Negative for abdominal distention and abdominal pain.  Endocrine: Negative for polyuria.  Genitourinary: Negative for difficulty urinating and dysuria.  Musculoskeletal: Negative for arthralgias and back pain.  Skin: Negative.   Neurological: Negative.   Psychiatric/Behavioral: Negative.      Objective:  BP 120/78   Pulse 60   Temp 98.2 F (36.8 C)   Resp 16   Ht 6' (1.829 m)   Wt 250 lb (113.4 kg)   BMI 33.91 kg/m   BP/Weight 08/19/2020 06/03/2020 02/16/2020  Systolic BP 120 121 132  Diastolic BP 78 80 86  Wt. (Lbs) 250 250 253  BMI 33.91 33.91 34.31    Physical Exam Vitals reviewed.  Constitutional:      Appearance: Normal appearance. He is obese.  HENT:  Head: Normocephalic.     Right Ear: Tympanic membrane normal.     Left Ear: Tympanic membrane normal.     Nose: Nose normal.     Mouth/Throat:     Mouth: Mucous membranes are moist.     Pharynx: Oropharynx is clear.  Eyes:     Extraocular Movements: Extraocular movements intact.     Conjunctiva/sclera: Conjunctivae normal.     Pupils: Pupils are equal, round, and reactive to light.  Cardiovascular:     Rate and Rhythm: Normal rate and regular rhythm.     Pulses: Normal pulses.      Heart sounds: Normal heart sounds. No murmur heard. No gallop.   Pulmonary:     Effort: Pulmonary effort is normal. No respiratory distress.     Breath sounds: No rales.  Abdominal:     General: Abdomen is flat. Bowel sounds are normal. There is no distension.     Tenderness: There is no abdominal tenderness.  Musculoskeletal:        General: Normal range of motion.     Cervical back: Normal range of motion and neck supple.  Skin:    General: Skin is warm and dry.     Capillary Refill: Capillary refill takes less than 2 seconds.  Neurological:     General: No focal deficit present.     Mental Status: He is alert and oriented to person, place, and time.  Psychiatric:        Mood and Affect: Mood normal.       Lab Results  Component Value Date   WBC 5.5 02/16/2020   HGB 15.8 02/16/2020   HCT 46.0 02/16/2020   PLT 211 02/16/2020   GLUCOSE 110 (H) 02/16/2020   CHOL 217 (H) 02/16/2020   TRIG 412 (H) 02/16/2020   HDL 37 (L) 02/16/2020   LDLCALC 109 (H) 02/16/2020   ALT 39 02/16/2020   AST 29 02/16/2020   NA 140 02/16/2020   K 4.6 02/16/2020   CL 102 02/16/2020   CREATININE 0.97 02/16/2020   BUN 17 02/16/2020   CO2 24 02/16/2020   HGBA1C 5.7 (H) 08/08/2019      Assessment & Plan:   Diagnoses and all orders for this visit: Essential hypertension, benign -     CBC with Differential/Platelet -     Comprehensive metabolic panel An individual hypertension care plan was established and reinforced today.  The patient's status was assessed using clinical findings on exam and labs or diagnostic tests. The patient's success at meeting treatment goals on disease specific evidence-based guidelines and found to be well controlled. SELF MANAGEMENT: The patient and I together assessed ways to personally work towards obtaining the recommended goals. RECOMMENDATIONS: avoid decongestants found in common cold remedies, decrease consumption of alcohol, perform routine monitoring of BP  with home BP cuff, exercise, reduction of dietary salt, take medicines as prescribed, try not to miss doses and quit smoking.  Regular exercise and maintaining a healthy weight is needed.  Stress reduction may help. A CLINICAL SUMMARY including written plan identify barriers to care unique to individual due to social or financial issues.  We attempt to mutually creat solutions for individual and family understanding.  BMI 32.0-32.9,adult An individualize plan was formulated for obesity using patient history and physical exam to encourage weight loss.  An evidence based program was formulated.  Patient is to cut portion size with meals and to plan physical exercise 3 days a week at least 20 minutes.  Weight  watchers and other programs are helpful.  Planned amount of weight loss 10 lbs.  Mixed hyperlipidemia -     Lipid panel AN INDIVIDUAL CARE PLAN for hyperlipidemia/ cholesterol was established and reinforced today.  The patient's status was assessed using clinical findings on exam, lab and other diagnostic tests. The patient's disease status was assessed based on evidence-based guidelines and found to be fair controlled. MEDICATIONS were reviewed. SELF MANAGEMENT GOALS have been discussed and patient's success at attaining the goal of low cholesterol was assessed. RECOMMENDATION given include regular exercise 3 days a week and low cholesterol/low fat diet. CLINICAL SUMMARY including written plan to identify barriers unique to the patient due to social or economic  reasons was discussed.   25 minute visit     Follow-up: Return in about 6 months (around 02/18/2021) for fasting.  An After Visit Summary was printed and given to the patient.  Brent Bulla, MD [facility] [phone]

## 2020-08-20 ENCOUNTER — Other Ambulatory Visit: Payer: Self-pay | Admitting: Legal Medicine

## 2020-08-20 LAB — CBC WITH DIFFERENTIAL/PLATELET
Basophils Absolute: 0 10*3/uL (ref 0.0–0.2)
Basos: 1 %
EOS (ABSOLUTE): 0.2 10*3/uL (ref 0.0–0.4)
Eos: 4 %
Hematocrit: 46.9 % (ref 37.5–51.0)
Hemoglobin: 15.8 g/dL (ref 13.0–17.7)
Immature Grans (Abs): 0 10*3/uL (ref 0.0–0.1)
Immature Granulocytes: 0 %
Lymphocytes Absolute: 1.6 10*3/uL (ref 0.7–3.1)
Lymphs: 34 %
MCH: 32.3 pg (ref 26.6–33.0)
MCHC: 33.7 g/dL (ref 31.5–35.7)
MCV: 96 fL (ref 79–97)
Monocytes Absolute: 0.4 10*3/uL (ref 0.1–0.9)
Monocytes: 8 %
Neutrophils Absolute: 2.5 10*3/uL (ref 1.4–7.0)
Neutrophils: 53 %
Platelets: 217 10*3/uL (ref 150–450)
RBC: 4.89 x10E6/uL (ref 4.14–5.80)
RDW: 12.5 % (ref 11.6–15.4)
WBC: 4.7 10*3/uL (ref 3.4–10.8)

## 2020-08-20 LAB — LIPID PANEL
Chol/HDL Ratio: 4.4 ratio (ref 0.0–5.0)
Cholesterol, Total: 175 mg/dL (ref 100–199)
HDL: 40 mg/dL (ref >39)
LDL Chol Calc (NIH): 95 mg/dL (ref 0–99)
Triglycerides: 239 mg/dL — ABNORMAL HIGH (ref 0–149)
VLDL Cholesterol Cal: 40 mg/dL (ref 5–40)

## 2020-08-20 LAB — COMPREHENSIVE METABOLIC PANEL
ALT: 40 IU/L (ref 0–44)
AST: 26 IU/L (ref 0–40)
Albumin/Globulin Ratio: 1.5 (ref 1.2–2.2)
Albumin: 4.3 g/dL (ref 3.8–4.9)
Alkaline Phosphatase: 90 IU/L (ref 44–121)
BUN/Creatinine Ratio: 12 (ref 9–20)
BUN: 12 mg/dL (ref 6–24)
Bilirubin Total: 0.5 mg/dL (ref 0.0–1.2)
CO2: 24 mmol/L (ref 20–29)
Calcium: 9.5 mg/dL (ref 8.7–10.2)
Chloride: 102 mmol/L (ref 96–106)
Creatinine, Ser: 1.03 mg/dL (ref 0.76–1.27)
Globulin, Total: 2.9 g/dL (ref 1.5–4.5)
Glucose: 114 mg/dL — ABNORMAL HIGH (ref 65–99)
Potassium: 5.2 mmol/L (ref 3.5–5.2)
Sodium: 141 mmol/L (ref 134–144)
Total Protein: 7.2 g/dL (ref 6.0–8.5)
eGFR: 88 mL/min/{1.73_m2} (ref >59)

## 2020-08-20 LAB — CARDIOVASCULAR RISK ASSESSMENT

## 2020-08-20 NOTE — Progress Notes (Signed)
CBC normal, glucose 114, kidney tests normal, liver tests normal, triglycerides high at 239 lp

## 2020-08-20 NOTE — Telephone Encounter (Signed)
Pt needs medication today

## 2020-08-28 ENCOUNTER — Ambulatory Visit: Payer: No Typology Code available for payment source | Admitting: Legal Medicine

## 2020-08-28 ENCOUNTER — Encounter: Payer: Self-pay | Admitting: Legal Medicine

## 2020-08-28 ENCOUNTER — Other Ambulatory Visit: Payer: Self-pay

## 2020-08-28 DIAGNOSIS — M7712 Lateral epicondylitis, left elbow: Secondary | ICD-10-CM

## 2020-08-28 NOTE — Progress Notes (Signed)
Subjective:  Patient ID: Jerome Olson, male    DOB: 09-07-1970  Age: 50 y.o. MRN: [id number]  Chief Complaint  Patient presents with  . Elbow Pain    Patient has left elbow pain since 10 days ago.     HPI : pain in left elbow for one month, pain at lateral epicondyle.  Jerome Olson is using naprosyn that helps.    Current Outpatient Medications on File Prior to Visit  Medication Sig Dispense Refill  . simvastatin (ZOCOR) 40 MG tablet TAKE 1 TABLET BY MOUTH EVERY DAY 30 tablet 6  . valsartan-hydrochlorothiazide (DIOVAN HCT) 80-12.5 MG tablet Take 1 tablet by mouth daily. 90 tablet 2   No current facility-administered medications on file prior to visit.   History reviewed. No pertinent past medical history. Past Surgical History:  Procedure Laterality Date  . Cyst Removed From Right Knee  90's  . LUMBAR LAMINECTOMY/DECOMPRESSION MICRODISCECTOMY N/A 12/25/2013   Procedure: MICRO LUMBAR DECOMPRESSION L2-L3 central;  Surgeon: Javier Docker, MD;  Location: [facility];  Service: Orthopedics;  Laterality: N/A;    Family History  Problem Relation Age of Onset  . Prostate cancer Father   . Heart Problems Father    Social History   Socioeconomic History  . Marital status: Married    Spouse name: Not on file  . Number of children: 1  . Years of education: Not on file  . Highest education level: Not on file  Occupational History  . Not on file  Tobacco Use  . Smoking status: Former Smoker    Types: Cigarettes    Quit date: 2009    Years since quitting: 13.3  . Smokeless tobacco: Never Used  Vaping Use  . Vaping Use: Never used  Substance and Sexual Activity  . Alcohol use: Yes    Alcohol/week: 2.0 standard drinks    Types: 2 Cans of beer per week    Comment: 3 times per week  . Drug use: No  . Sexual activity: Yes  Other Topics Concern  . Not on file  Social History Narrative  . Not on file   Social Determinants of Health   Financial Resource Strain: Not on file  Food  Insecurity: Not on file  Transportation Needs: Not on file  Physical Activity: Not on file  Stress: Not on file  Social Connections: Not on file    Review of Systems  Constitutional: Negative for activity change and appetite change.  HENT: Negative for congestion.   Eyes: Negative for visual disturbance.  Respiratory: Negative for chest tightness and shortness of breath.   Cardiovascular: Negative for chest pain and leg swelling.  Gastrointestinal: Negative for abdominal distention and abdominal pain.  Endocrine: Negative.   Genitourinary: Negative.   Musculoskeletal: Positive for arthralgias.       Pain left lateral epicondyle  Skin: Negative.   Neurological: Negative.   Psychiatric/Behavioral: Negative.      Objective:  BP 124/82   Pulse 70   Temp 97.7 F (36.5 C)   Resp 16   Ht 6' (1.829 m)   Wt 251 lb (113.9 kg)   SpO2 95%   BMI 34.04 kg/m   BP/Weight 08/28/2020 08/19/2020 06/03/2020  Systolic BP 124 120 121  Diastolic BP 82 78 80  Wt. (Lbs) 251 250 250  BMI 34.04 33.91 33.91    Physical Exam Vitals reviewed.  Constitutional:      Appearance: Jerome Olson is normal weight.  Cardiovascular:     Rate and Rhythm:  Normal rate and regular rhythm.     Pulses: Normal pulses.     Heart sounds: Normal heart sounds.  Pulmonary:     Breath sounds: Normal breath sounds.  Musculoskeletal:        General: Tenderness (left lateral epicondyle) present.       Arms:     Comments: Pain over lateral epicondyle full ROm elbow, pain increased on supination against resistance.  Neurological:     General: No focal deficit present.     Mental Status: Jerome Olson is alert and oriented to person, place, and time.       Lab Results  Component Value Date   WBC 4.7 08/19/2020   HGB 15.8 08/19/2020   HCT 46.9 08/19/2020   PLT 217 08/19/2020   GLUCOSE 114 (H) 08/19/2020   CHOL 175 08/19/2020   TRIG 239 (H) 08/19/2020   HDL 40 08/19/2020   LDLCALC 95 08/19/2020   ALT 40 08/19/2020   AST 26  08/19/2020   NA 141 08/19/2020   K 5.2 08/19/2020   CL 102 08/19/2020   CREATININE 1.03 08/19/2020   BUN 12 08/19/2020   CO2 24 08/19/2020   HGBA1C 5.7 (H) 08/08/2019      Assessment & Plan:   Diagnoses and all orders for this visit: Lateral epicondylitis of left elbow Patient is having acute left lateral epicondylitis for one month.  This was injected   After consent was obtained, using sterile technique the left elbow was prepped and plain Ethyl Chloride was used as local anesthetic. The tendon sheath was entered and  Steroid 40 mg and 1 ml plain Lidocaine was then injected and the needle withdrawn.  The procedure was well tolerated.  The patient is asked to continue to rest the joint for a few more days before resuming regular activities.  It may be more painful for the first 1-2 days.  Watch for fever, or increased swelling or persistent pain in the joint. Call or return to clinic prn if such symptoms occur or there is failure to improve as anticipated.     Follow-up: Return if symptoms worsen or fail to improve.  An After Visit Summary was printed and given to the patient.  Brent Bulla, MD [facility] [phone]

## 2020-08-28 NOTE — Patient Instructions (Signed)
Elbow and Forearm Exercises Ask your health care provider which exercises are safe for you. Do exercises exactly as told by your health care provider and adjust them as directed. It is normal to feel mild stretching, pulling, tightness, or discomfort as you do these exercises. Stop right away if you feel sudden pain or your pain gets worse. Do not begin these exercises until told by your health care provider. Range-of-motion exercises These exercises warm up your muscles and joints and improve the movement and flexibility of your injured elbow and forearm. The exercises also help to relieve pain, numbness, and tingling. These exercises are done using the muscles in your injured elbow and forearm (active). Elbow flexion, active 1. Hold your left / right arm at your side, and bend your elbow (flexion) as far as you can using only your arm muscles. 2. Hold this position for __________ seconds. 3. Slowly return to the starting position. Repeat __________ times. Complete this exercise __________ times a day. Elbow extension, active 1. Hold your left / right arm at your side, and straighten your elbow (extension) as much as you can using only your arm muscles. 2. Hold this position for __________ seconds. 3. Slowly return to the starting position. Repeat __________ times. Complete this exercise __________ times a day. Active forearm rotation, supination This is an exercise in which you turn (rotate) your forearm palm up (supination). 1. Stand or sit with your elbows at your sides. 2. Bend your left / right elbow to a 90-degree angle (right angle). 3. Rotate your palm up until you feel a gentle stretch on the inside of your forearm. 4. Hold this position for __________ seconds. 5. Slowly return to the starting position. Repeat __________ times. Complete this exercise __________ times a day. Active forearm rotation, pronation This is an exercise in which you turn (rotate) your forearm palm down  (pronation). 1. Stand or sit with your elbows at your sides. 2. Bend your left / right elbow to a 90-degree angle (right angle). 3. Rotate your palm down until you feel a gentle stretch on the top of your forearm. 4. Hold this position for __________ seconds. 5. Slowly return to the starting position. Repeat __________ times. Complete this exercise __________ times a day. Stretching exercises These exercises warm up your muscles and joints and improve the movement and flexibility of your injured elbow and forearm. These exercises also help to relieve pain, numbness, and tingling. These exercises are done using your healthy elbow and forearm to help stretch the muscles in your injured elbow and forearm (active-assisted). Elbow flexion, active-assisted 1. Hold your left / right arm at your side, and bend your elbow (flexion) as much as you can using your left / right arm muscles. 2. Use your other hand to bend your left / right elbow farther. To do this, gently push up on your forearm until you feel a gentle stretch on the back of your elbow. 3. Hold this position for __________ seconds. 4. Slowly return to the starting position. Repeat __________ times. Complete this exercise __________ times a day.   Elbow extension, active-assisted 1. Hold your left / right arm at your side, and straighten your elbow (extension) as much as you can using your left / right arm muscles. 2. Use your other hand to straighten the left / right elbow farther. To do this, gently push down on your forearm until you feel a gentle stretch on the inside of your elbow. 3. Hold this position for __________   seconds. 4. Slowly return to the starting position. Repeat __________ times. Complete this exercise __________ times a day.   Active-assisted forearm rotation, supination This is an exercise in which you turn (rotate) your forearm palm up (supination). 1. Sit with your left / right elbow bent in a 90-degree angle (right  angle) with your forearm resting on a table. 2. Keeping your upper body and shoulder still, rotate your forearm so your palm faces upward. 3. Use your other hand to help rotate your forearm further until you feel a gentle to moderate stretch. 4. Hold this position for __________ seconds. 5. Slowly release the stretch and return to the starting position. Repeat __________ times. Complete this exercise __________ times a day. Active-assisted forearm rotation, pronation This is an exercise in which you turn (rotate) your forearm palm down (pronation). 1. Sit with your left / right elbow bent in a 90-degree angle (right angle) with your forearm resting on a table. 2. Keeping your upper body and shoulder still, rotate your forearm so your palm faces the tabletop. 3. Use your other hand to help rotate your forearm further until you feel a gentle to moderate stretch. 4. Hold this position for __________ seconds. 5. Slowly release the stretch and return to the starting position. Repeat __________ times. Complete this exercise __________ times a day.   Passive elbow flexion, supine 1. Lie on your back (supine position). 2. Extend your left / right arm up in the air, bracing it with your other hand. 3. Let your left / right hand slowly lower toward your shoulder (passive flexion), while your elbow stays pointed toward the ceiling. You should feel a gentle stretch along the back of your upper arm and elbow. 4. If instructed by your health care provider, you may increase the intensity of your stretch by adding a small wrist weight or hand weight. 5. Hold this position for __________ seconds. 6. Slowly return to the starting position. Repeat __________ times. Complete this exercise __________ times a day. Passive elbow extension, supine 1. Lie on your back (supine position). Make sure that you are in a comfortable position that lets you relax your arm muscles. 2. Place a folded towel under your left /  right upper arm so your elbow and shoulder are at the same height. Straighten your left / right arm so your elbow does not rest on the bed or towel. 3. Let the weight of your hand stretch your elbow (passive extension). Keep your arm and chest muscles relaxed. You should feel a stretch on the inside of your elbow. 4. If told by your health care provider, you may increase the intensity of your stretch by adding a small wrist weight or hand weight. 5. Hold this position for __________ seconds. 6. Slowly release the stretch. Repeat __________ times. Complete this exercise __________ times a day.   Strengthening exercises These exercises build strength and endurance in your elbow and forearm. Endurance is the ability to use your muscles for a long time, even after they get tired. Elbow flexion, isometric 1. Stand or sit up straight. 2. Bend your left / right elbow in a 90-degree angle (right angle), and keep your forearm at the height of your waist. Your thumb should be pointed toward the ceiling (neutral forearm). 3. Place your other hand on top of your left / right forearm. Gently push down while you resist with your left / right arm (isometric flexion). Push as hard as you can with both arms without causing   any pain or movement at your left / right elbow. 4. Hold this position for __________ seconds. 5. Slowly release the tension in both arms. Let your muscles relax completely before you repeat the exercise. Repeat __________ times. Complete this exercise __________ times a day.   Elbow extension, isometric 1. Stand or sit up straight. 2. Place your left / right arm so your palm faces your abdomen and is at the height of your waist. 3. Place your other hand on the underside of your left / right forearm. Gently push up while you resist with your left / right arm (isometric extension). Push as hard as you can with both arms without causing any pain or movement at your left / right elbow. 4. Hold this  position for __________ seconds. 5. Slowly release the tension in both arms. Let your muscles relax completely before you repeat the exercise. Repeat __________ times. Complete this exercise __________ times a day.   Elbow flexion with forearm palm up 1. Sit on a firm chair without armrests, or stand up. 2. Place your left / right arm at your side with your elbow straight and your palm facing forward. 3. Holding a __________weight or gripping a rubber exercise band or tubing, bend your elbow to bring your hand toward your shoulder (flexion). 4. Hold this position for __________ seconds. 5. Slowly return to the starting position. Repeat __________ times. Complete this exercise __________ times a day.   Elbow extension, active 1. Sit on a firm chair without armrests, or stand up. 2. Hold a rubber exercise band or tubing in both hands. 3. Keeping your upper arms at your sides, bring both hands up to your left / right shoulder. Keep your left / right hand just below your other hand. 4. Straighten your left / right elbow (extension) while keeping your other arm still. 5. Hold this position for __________ seconds. 6. Control the resistance of the band or tubing as you return to the starting position. Repeat __________ times. Complete this exercise __________ times a day.   Forearm rotation, supination 1. Sit with your left / right forearm supported on a table. Your elbow should be at waist height and bent at a 90-degree angle (right angle). 2. Gently grasp a lightweight hammer. 3. Rest your hand over the edge of the table with your palm facing down. 4. Without moving your left / right elbow, slowly rotate your forearm to turn your palm up toward the ceiling (supination). 5. Hold this position for __________ seconds. 6. Slowly return to the starting position. Repeat __________ times. Complete this exercise __________ times a day.   Forearm rotation, pronation 1. Sit with your left / right forearm  supported on a table. Keep your elbow below shoulder height. 2. Gently grasp a lightweight hammer. 3. Rest your hand over the edge of the table with your palm facing up. 4. Without moving your left / right elbow, slowly rotate your forearm to turn your palm down toward the floor (pronation). 5. Hold this position for __________seconds. 6. Slowly return to the starting position. Repeat __________ times. Complete this exercise __________ times a day.   This information is not intended to replace advice given to you by your health care provider. Make sure you discuss any questions you have with your health care provider. Document Revised: 08/18/2018 Document Reviewed: 05/18/2018 Elsevier Patient Education  2021 [employer]  

## 2020-10-25 DIAGNOSIS — S92309A Fracture of unspecified metatarsal bone(s), unspecified foot, initial encounter for closed fracture: Secondary | ICD-10-CM | POA: Insufficient documentation

## 2020-10-29 ENCOUNTER — Ambulatory Visit: Payer: No Typology Code available for payment source | Admitting: Legal Medicine

## 2020-10-29 ENCOUNTER — Other Ambulatory Visit: Payer: Self-pay

## 2020-10-29 ENCOUNTER — Encounter: Payer: Self-pay | Admitting: Legal Medicine

## 2020-10-29 VITALS — BP 124/66 | HR 66 | Temp 96.5°F | Resp 15 | Ht 72.0 in | Wt 246.0 lb

## 2020-10-29 DIAGNOSIS — I10 Essential (primary) hypertension: Secondary | ICD-10-CM

## 2020-10-29 DIAGNOSIS — M7712 Lateral epicondylitis, left elbow: Secondary | ICD-10-CM | POA: Diagnosis not present

## 2020-10-29 DIAGNOSIS — E782 Mixed hyperlipidemia: Secondary | ICD-10-CM | POA: Diagnosis not present

## 2020-10-29 NOTE — Progress Notes (Signed)
Established Patient Office Visit  Subjective:  Patient ID: Jerome Olson, male    DOB: 06-03-1970  Age: 50 y.o. MRN: [id number]  CC:  Chief Complaint  Patient presents with   Elbow Pain    Left elbow pain since 3 months ago.    HPI Jerome Olson presents for lateral epicondyitis  He is still having pain in left elbow.  He was seen in urgent care and given prednisone and meloxicam,  It is affecting his work and he has been layed off.  History reviewed. No pertinent past medical history.  Past Surgical History:  Procedure Laterality Date   Cyst Removed From Right Knee  90's   LUMBAR LAMINECTOMY/DECOMPRESSION MICRODISCECTOMY N/A 12/25/2013   Procedure: MICRO LUMBAR DECOMPRESSION L2-L3 central;  Surgeon: Johnn Hai, MD;  Location: [facility];  Service: Orthopedics;  Laterality: N/A;    Family History  Problem Relation Age of Onset   Prostate cancer Father    Heart Problems Father     Social History   Socioeconomic History   Marital status: Married    Spouse name: Not on file   Number of children: 1   Years of education: Not on file   Highest education level: Not on file  Occupational History   Not on file  Tobacco Use   Smoking status: Former    Pack years: 0.00    Types: Cigarettes    Quit date: 2009    Years since quitting: 13.4   Smokeless tobacco: Never  Vaping Use   Vaping Use: Never used  Substance and Sexual Activity   Alcohol use: Yes    Alcohol/week: 2.0 standard drinks    Types: 2 Cans of beer per week    Comment: 3 times per week   Drug use: No   Sexual activity: Yes  Other Topics Concern   Not on file  Social History Narrative   Not on file   Social Determinants of Health   Financial Resource Strain: Not on file  Food Insecurity: Not on file  Transportation Needs: Not on file  Physical Activity: Not on file  Stress: Not on file  Social Connections: Not on file  Intimate Partner Violence: Not on file    Outpatient Medications  Prior to Visit  Medication Sig Dispense Refill   simvastatin (ZOCOR) 40 MG tablet TAKE 1 TABLET BY MOUTH EVERY DAY 30 tablet 6   valsartan-hydrochlorothiazide (DIOVAN HCT) 80-12.5 MG tablet Take 1 tablet by mouth daily. 90 tablet 2   No facility-administered medications prior to visit.    No Known Allergies  ROS Review of Systems  Constitutional:  Negative for chills, fatigue and fever.  HENT:  Negative for congestion, ear pain and sore throat.   Respiratory:  Negative for cough and shortness of breath.   Cardiovascular:  Negative for chest pain.  Gastrointestinal:  Negative for abdominal pain, constipation, diarrhea, nausea and vomiting.  Endocrine: Negative for polydipsia, polyphagia and polyuria.  Genitourinary:  Negative for dysuria and frequency.  Musculoskeletal:  Positive for arthralgias (left lateral epicondylitis). Negative for myalgias.  Neurological:  Negative for dizziness and headaches.  Psychiatric/Behavioral:  Negative for dysphoric mood.        No dysphoria     Objective:    Physical Exam Vitals reviewed.  Constitutional:      Appearance: Normal appearance.  HENT:     Right Ear: Tympanic membrane, ear canal and external ear normal.     Left Ear: Tympanic membrane, ear canal  and external ear normal.  Eyes:     Extraocular Movements: Extraocular movements intact.     Conjunctiva/sclera: Conjunctivae normal.     Pupils: Pupils are equal, round, and reactive to light.  Cardiovascular:     Rate and Rhythm: Normal rate and regular rhythm.     Pulses: Normal pulses.     Heart sounds: Normal heart sounds. No murmur heard.   No gallop.  Pulmonary:     Effort: Pulmonary effort is normal. No respiratory distress.     Breath sounds: Normal breath sounds. No wheezing.  Abdominal:     General: Abdomen is flat. Bowel sounds are normal.     Palpations: Abdomen is soft.  Musculoskeletal:        General: Tenderness present.     Cervical back: Normal range of motion  and neck supple.     Comments: Pain left lateral epicondyle increased with wrist extension and supination forearm.  Injection no help steroids and NSADs no help  Skin:    General: Skin is warm.  Neurological:     General: No focal deficit present.     Mental Status: He is alert and oriented to person, place, and time. Mental status is at baseline.    BP 124/66   Pulse 66   Temp (!) 96.5 F (35.8 C)   Resp 15   Ht 6' (1.829 m)   Wt 246 lb (111.6 kg)   SpO2 97%   BMI 33.36 kg/m  Wt Readings from Last 3 Encounters:  10/29/20 246 lb (111.6 kg)  08/28/20 251 lb (113.9 kg)  08/19/20 250 lb (113.4 kg)     Health Maintenance Due  Topic Date Due   HIV Screening  Never done   Hepatitis C Screening  Never done   TETANUS/TDAP  Never done   COLONOSCOPY (Pts 45-60yr Insurance coverage will need to be confirmed)  Never done   Zoster Vaccines- Shingrix (1 of 2) Never done    There are no preventive care reminders to display for this patient.  No results found for: TSH Lab Results  Component Value Date   WBC 4.7 08/19/2020   HGB 15.8 08/19/2020   HCT 46.9 08/19/2020   MCV 96 08/19/2020   PLT 217 08/19/2020   Lab Results  Component Value Date   NA 141 08/19/2020   K 5.2 08/19/2020   CO2 24 08/19/2020   GLUCOSE 114 (H) 08/19/2020   BUN 12 08/19/2020   CREATININE 1.03 08/19/2020   BILITOT 0.5 08/19/2020   ALKPHOS 90 08/19/2020   AST 26 08/19/2020   ALT 40 08/19/2020   PROT 7.2 08/19/2020   ALBUMIN 4.3 08/19/2020   CALCIUM 9.5 08/19/2020   ANIONGAP 12 12/26/2013   EGFR 88 08/19/2020   Lab Results  Component Value Date   CHOL 175 08/19/2020   Lab Results  Component Value Date   HDL 40 08/19/2020   Lab Results  Component Value Date   LDLCALC 95 08/19/2020   Lab Results  Component Value Date   TRIG 239 (H) 08/19/2020   Lab Results  Component Value Date   CHOLHDL 4.4 08/19/2020   Lab Results  Component Value Date   HGBA1C 5.7 (H) 08/08/2019       Assessment & Plan:   Diagnoses and all orders for this visit: Essential hypertension, benign An individual hypertension care plan was established and reinforced today.  The patient's status was assessed using clinical findings on exam and labs or diagnostic tests. The patient's  success at meeting treatment goals on disease specific evidence-based guidelines and found to be well controlled. SELF MANAGEMENT: The patient and I together assessed ways to personally work towards obtaining the recommended goals. RECOMMENDATIONS: avoid decongestants found in common cold remedies, decrease consumption of alcohol, perform routine monitoring of BP with home BP cuff, exercise, reduction of dietary salt, take medicines as prescribed, try not to miss doses and quit smoking.  Regular exercise and maintaining a healthy weight is needed.  Stress reduction may help. A CLINICAL SUMMARY including written plan identify barriers to care unique to individual due to social or financial issues.  We attempt to mutually creat solutions for individual and family understanding.   Mixed hyperlipidemia AN INDIVIDUAL CARE PLAN for hyperlipidemia/ cholesterol was established and reinforced today.  The patient's status was assessed using clinical findings on exam, lab and other diagnostic tests. The patient's disease status was assessed based on evidence-based guidelines and found to be fair controlled. MEDICATIONS were reviewed. SELF MANAGEMENT GOALS have been discussed and patient's success at attaining the goal of low cholesterol was assessed. RECOMMENDATION given include regular exercise 3 days a week and low cholesterol/low fat diet. CLINICAL SUMMARY including written plan to identify barriers unique to the patient due to social or economic  reasons was discussed.   Lateral epicondylitis of left elbow -     AMB referral to orthopedics  Continue epicondylitis refer to orthopedics    Follow-up: Return in about 6 months  (around 04/30/2021) for bp.    Reinaldo Meeker, MD

## 2021-02-17 ENCOUNTER — Ambulatory Visit: Payer: No Typology Code available for payment source | Admitting: Legal Medicine

## 2021-04-10 ENCOUNTER — Other Ambulatory Visit: Payer: Self-pay | Admitting: Legal Medicine

## 2021-04-10 DIAGNOSIS — I10 Essential (primary) hypertension: Secondary | ICD-10-CM

## 2021-04-18 ENCOUNTER — Ambulatory Visit: Payer: BC Managed Care – PPO | Admitting: Legal Medicine

## 2021-04-18 ENCOUNTER — Encounter: Payer: Self-pay | Admitting: Legal Medicine

## 2021-04-18 ENCOUNTER — Other Ambulatory Visit: Payer: Self-pay

## 2021-04-18 VITALS — BP 134/72 | HR 67 | Temp 97.1°F | Ht 72.0 in | Wt 252.0 lb

## 2021-04-18 DIAGNOSIS — Z6834 Body mass index (BMI) 34.0-34.9, adult: Secondary | ICD-10-CM

## 2021-04-18 DIAGNOSIS — E782 Mixed hyperlipidemia: Secondary | ICD-10-CM | POA: Diagnosis not present

## 2021-04-18 DIAGNOSIS — I10 Essential (primary) hypertension: Secondary | ICD-10-CM

## 2021-04-18 NOTE — Progress Notes (Signed)
Established Patient Office Visit  Subjective:  Patient ID: Jerome Olson, male    DOB: September 02, 1970  Age: 50 y.o. MRN: [id number]  CC:  Chief Complaint  Patient presents with   Hypertension   Hyperlipidemia    HPI Jerome Olson presents for chronic visit  Patient presents for follow up of hypertension.  Patient tolerating valsartan/hctz well with side effects.  Patient was diagnosed with hypertension 2000 so has been treated for hypertension for 2 years.Patient is working on maintaining diet and exercise regimen and follows up as directed. Complication include none.   Patient presents with hyperlipidemia.  Compliance with treatment has been good; patient takes medicines as directed, maintains low cholesterol diet, follows up as directed, and maintains exercise regimen.  Patient is using simvastatin without problems.   History reviewed. No pertinent past medical history.  Past Surgical History:  Procedure Laterality Date   Cyst Removed From Right Knee  90's   LUMBAR LAMINECTOMY/DECOMPRESSION MICRODISCECTOMY N/A 12/25/2013   Procedure: MICRO LUMBAR DECOMPRESSION L2-L3 central;  Surgeon: Johnn Hai, MD;  Location: [facility];  Service: Orthopedics;  Laterality: N/A;    Family History  Problem Relation Age of Onset   Prostate cancer Father    Heart Problems Father     Social History   Socioeconomic History   Marital status: Married    Spouse name: Not on file   Number of children: 1   Years of education: Not on file   Highest education level: Not on file  Occupational History   Not on file  Tobacco Use   Smoking status: Former    Types: Cigarettes    Quit date: 2009    Years since quitting: 13.9   Smokeless tobacco: Never  Vaping Use   Vaping Use: Never used  Substance and Sexual Activity   Alcohol use: Yes    Alcohol/week: 2.0 standard drinks    Types: 2 Cans of beer per week    Comment: 3 times per week   Drug use: No   Sexual activity: Yes  Other Topics  Concern   Not on file  Social History Narrative   Not on file   Social Determinants of Health   Financial Resource Strain: Not on file  Food Insecurity: Not on file  Transportation Needs: Not on file  Physical Activity: Not on file  Stress: Not on file  Social Connections: Not on file  Intimate Partner Violence: Not on file    Outpatient Medications Prior to Visit  Medication Sig Dispense Refill   simvastatin (ZOCOR) 40 MG tablet TAKE 1 TABLET BY MOUTH EVERY DAY 30 tablet 6   valsartan-hydrochlorothiazide (DIOVAN-HCT) 80-12.5 MG tablet TAKE 1 TABLET BY MOUTH DAILY 90 tablet 2   No facility-administered medications prior to visit.    No Known Allergies  ROS Review of Systems  Constitutional:  Negative for activity change and appetite change.  HENT:  Negative for congestion.   Eyes:  Negative for visual disturbance.  Respiratory:  Negative for chest tightness and shortness of breath.   Cardiovascular:  Negative for chest pain, palpitations and leg swelling.  Gastrointestinal:  Negative for abdominal distention and abdominal pain.  Genitourinary: Negative.   Musculoskeletal:  Negative for arthralgias and back pain.  Skin: Negative.   Neurological: Negative.   Psychiatric/Behavioral: Negative.       Objective:    Physical Exam Vitals reviewed.  Constitutional:      General: He is not in acute distress.    Appearance:  Normal appearance.  HENT:     Head: Normocephalic.     Right Ear: Tympanic membrane normal.     Left Ear: Tympanic membrane normal.     Nose: Nose normal.     Mouth/Throat:     Mouth: Mucous membranes are moist.     Pharynx: Oropharynx is clear.  Eyes:     Extraocular Movements: Extraocular movements intact.     Conjunctiva/sclera: Conjunctivae normal.     Pupils: Pupils are equal, round, and reactive to light.  Cardiovascular:     Rate and Rhythm: Normal rate and regular rhythm.     Pulses: Normal pulses.     Heart sounds: No murmur heard.    No gallop.  Pulmonary:     Effort: Pulmonary effort is normal.     Breath sounds: Normal breath sounds.  Abdominal:     General: Abdomen is flat. Bowel sounds are normal. There is no distension.     Palpations: Abdomen is soft.     Tenderness: There is no abdominal tenderness.  Musculoskeletal:        General: Normal range of motion.     Cervical back: Normal range of motion and neck supple.     Right lower leg: No edema.     Left lower leg: No edema.  Skin:    General: Skin is warm.     Capillary Refill: Capillary refill takes less than 2 seconds.  Neurological:     General: No focal deficit present.     Mental Status: He is alert and oriented to person, place, and time. Mental status is at baseline.     Gait: Gait normal.     Deep Tendon Reflexes: Reflexes normal.  Psychiatric:        Mood and Affect: Mood normal.        Thought Content: Thought content normal.        Judgment: Judgment normal.    BP 134/72   Pulse 67   Temp (!) 97.1 F (36.2 C)   Ht 6' (1.829 m)   Wt 252 lb (114.3 kg)   SpO2 98%   BMI 34.18 kg/m  Wt Readings from Last 3 Encounters:  04/18/21 252 lb (114.3 kg)  10/29/20 246 lb (111.6 kg)  08/28/20 251 lb (113.9 kg)     Health Maintenance Due  Topic Date Due   TETANUS/TDAP  Never done   Zoster Vaccines- Shingrix (1 of 2) Never done    There are no preventive care reminders to display for this patient.  No results found for: TSH Lab Results  Component Value Date   WBC 5.5 04/18/2021   HGB 16.2 04/18/2021   HCT 47.5 04/18/2021   MCV 95 04/18/2021   PLT 204 04/18/2021   Lab Results  Component Value Date   NA 141 04/18/2021   K 5.1 04/18/2021   CO2 25 04/18/2021   GLUCOSE 112 (H) 04/18/2021   BUN 14 04/18/2021   CREATININE 1.05 04/18/2021   BILITOT 0.3 04/18/2021   ALKPHOS 98 04/18/2021   AST 27 04/18/2021   ALT 43 04/18/2021   PROT 7.0 04/18/2021   ALBUMIN 4.7 04/18/2021   CALCIUM 9.6 04/18/2021   ANIONGAP 12 12/26/2013   EGFR  85 04/18/2021   Lab Results  Component Value Date   CHOL 182 04/18/2021   Lab Results  Component Value Date   HDL 37 (L) 04/18/2021   Lab Results  Component Value Date   LDLCALC 118 (H) 04/18/2021  Lab Results  Component Value Date   TRIG 151 (H) 04/18/2021   Lab Results  Component Value Date   CHOLHDL 4.9 04/18/2021   Lab Results  Component Value Date   HGBA1C 5.7 (H) 08/08/2019      Assessment & Plan:   Problem List Items Addressed This Visit       Cardiovascular and Mediastinum   Essential hypertension, benign   Relevant Orders   CBC with Differential/Platelet (Completed)   Comprehensive metabolic panel (Completed) An individual hypertension care plan was established and reinforced today.  The patient's status was assessed using clinical findings on exam and labs or diagnostic tests. The patient's success at meeting treatment goals on disease specific evidence-based guidelines and found to be well controlled. SELF MANAGEMENT: The patient and I together assessed ways to personally work towards obtaining the recommended goals. RECOMMENDATIONS: avoid decongestants found in common cold remedies, decrease consumption of alcohol, perform routine monitoring of BP with home BP cuff, exercise, reduction of dietary salt, take medicines as prescribed, try not to miss doses and quit smoking.  Regular exercise and maintaining a healthy weight is needed.  Stress reduction may help. A CLINICAL SUMMARY including written plan identify barriers to care unique to individual due to social or financial issues.  We attempt to mutually creat solutions for individual and family understanding.      Other   Mixed hyperlipidemia   Relevant Orders   Lipid panel (Completed) AN INDIVIDUAL CARE PLAN for hyperlipidemia/ cholesterol was established and reinforced today.  The patient's status was assessed using clinical findings on exam, lab and other diagnostic tests. The patient's disease status  was assessed based on evidence-based guidelines and found to be well controlled. MEDICATIONS were reviewed. SELF MANAGEMENT GOALS have been discussed and patient's success at attaining the goal of low cholesterol was assessed. RECOMMENDATION given include regular exercise 3 days a week and low cholesterol/low fat diet. CLINICAL SUMMARY including written plan to identify barriers unique to the patient due to social or economic  reasons was discussed.    BMI 34.0-34.9,adult - Primary An individualize plan was formulated for obesity using patient history and physical exam to encourage weight loss.  An evidence based program was formulated.  Patient is to cut portion size with meals and to plan physical exercise 3 days a week at least 20 minutes.  Weight watchers and other programs are helpful.  Planned amount of weight loss 10 lbs.        Follow-up: Return in about 6 months (around 10/17/2021) for fasting.    Reinaldo Meeker, MD

## 2021-04-19 LAB — CBC WITH DIFFERENTIAL/PLATELET
Basophils Absolute: 0.1 10*3/uL (ref 0.0–0.2)
Basos: 1 %
EOS (ABSOLUTE): 0.2 10*3/uL (ref 0.0–0.4)
Eos: 3 %
Hematocrit: 47.5 % (ref 37.5–51.0)
Hemoglobin: 16.2 g/dL (ref 13.0–17.7)
Immature Grans (Abs): 0 10*3/uL (ref 0.0–0.1)
Immature Granulocytes: 0 %
Lymphocytes Absolute: 1.8 10*3/uL (ref 0.7–3.1)
Lymphs: 32 %
MCH: 32.3 pg (ref 26.6–33.0)
MCHC: 34.1 g/dL (ref 31.5–35.7)
MCV: 95 fL (ref 79–97)
Monocytes Absolute: 0.4 10*3/uL (ref 0.1–0.9)
Monocytes: 7 %
Neutrophils Absolute: 3.1 10*3/uL (ref 1.4–7.0)
Neutrophils: 57 %
Platelets: 204 10*3/uL (ref 150–450)
RBC: 5.02 x10E6/uL (ref 4.14–5.80)
RDW: 12.2 % (ref 11.6–15.4)
WBC: 5.5 10*3/uL (ref 3.4–10.8)

## 2021-04-19 LAB — COMPREHENSIVE METABOLIC PANEL
ALT: 43 IU/L (ref 0–44)
AST: 27 IU/L (ref 0–40)
Albumin/Globulin Ratio: 2 (ref 1.2–2.2)
Albumin: 4.7 g/dL (ref 3.8–4.9)
Alkaline Phosphatase: 98 IU/L (ref 44–121)
BUN/Creatinine Ratio: 13 (ref 9–20)
BUN: 14 mg/dL (ref 6–24)
Bilirubin Total: 0.3 mg/dL (ref 0.0–1.2)
CO2: 25 mmol/L (ref 20–29)
Calcium: 9.6 mg/dL (ref 8.7–10.2)
Chloride: 102 mmol/L (ref 96–106)
Creatinine, Ser: 1.05 mg/dL (ref 0.76–1.27)
Globulin, Total: 2.3 g/dL (ref 1.5–4.5)
Glucose: 112 mg/dL — ABNORMAL HIGH (ref 70–99)
Potassium: 5.1 mmol/L (ref 3.5–5.2)
Sodium: 141 mmol/L (ref 134–144)
Total Protein: 7 g/dL (ref 6.0–8.5)
eGFR: 85 mL/min/{1.73_m2} (ref >59)

## 2021-04-19 LAB — LIPID PANEL
Chol/HDL Ratio: 4.9 ratio (ref 0.0–5.0)
Cholesterol, Total: 182 mg/dL (ref 100–199)
HDL: 37 mg/dL — ABNORMAL LOW (ref >39)
LDL Chol Calc (NIH): 118 mg/dL — ABNORMAL HIGH (ref 0–99)
Triglycerides: 151 mg/dL — ABNORMAL HIGH (ref 0–149)
VLDL Cholesterol Cal: 27 mg/dL (ref 5–40)

## 2021-04-19 LAB — CARDIOVASCULAR RISK ASSESSMENT

## 2021-04-20 NOTE — Progress Notes (Signed)
CBC normal, glucose 112, kidney tests normal, liver tests normal, triglycerides high 151, LDL cholesterol 118 high still on simvastatin?,  lp

## 2021-05-14 ENCOUNTER — Other Ambulatory Visit: Payer: Self-pay | Admitting: Legal Medicine

## 2021-06-16 ENCOUNTER — Ambulatory Visit: Payer: BC Managed Care – PPO | Admitting: Legal Medicine

## 2021-06-16 ENCOUNTER — Encounter: Payer: Self-pay | Admitting: Legal Medicine

## 2021-06-16 VITALS — BP 120/84 | HR 80 | Temp 98.0°F | Resp 15 | Ht 72.0 in | Wt 256.0 lb

## 2021-06-16 DIAGNOSIS — J029 Acute pharyngitis, unspecified: Secondary | ICD-10-CM

## 2021-06-16 DIAGNOSIS — J019 Acute sinusitis, unspecified: Secondary | ICD-10-CM

## 2021-06-16 LAB — POC COVID19 BINAXNOW: SARS Coronavirus 2 Ag: NEGATIVE

## 2021-06-16 LAB — POCT INFLUENZA A/B
Influenza A, POC: NEGATIVE
Influenza B, POC: NEGATIVE

## 2021-06-16 MED ORDER — PREDNISONE 10 MG (21) PO TBPK: ORAL_TABLET | ORAL | 0 refills | Status: DC

## 2021-06-16 MED ORDER — AMOXICILLIN-POT CLAVULANATE 875-125 MG PO TABS: 1.0000 | ORAL_TABLET | Freq: Two times a day (BID) | ORAL | 0 refills | Status: DC

## 2021-06-16 NOTE — Progress Notes (Signed)
Acute Office Visit  Subjective:    Patient ID: Jerome Olson, male    DOB: [birth year], 51 y.o.   MRN: [id number]  Chief Complaint  Patient presents with   Sore Throat   Cough    HPI: Patient is in today for nasal congestion, sore throat due to pos nasal drainage, sinus pressure, and cough. He denied chest pain, fever, chills,                                                                                                               History reviewed. No pertinent past medical history.  Past Surgical History:  Procedure Laterality Date   Cyst Removed From Right Knee  90's   LUMBAR LAMINECTOMY/DECOMPRESSION MICRODISCECTOMY N/A 12/25/2013   Procedure: MICRO LUMBAR DECOMPRESSION L2-L3 central;  Surgeon: Johnn Hai, MD;  Location: [facility];  Service: Orthopedics;  Laterality: N/A;    Family History  Problem Relation Age of Onset   Prostate cancer Father    Heart Problems Father     Social History   Socioeconomic History   Marital status: Married    Spouse name: Not on file   Number of children: 1   Years of education: Not on file   Highest education level: Not on file  Occupational History   Not on file  Tobacco Use   Smoking status: Former    Types: Cigarettes    Quit date: 2009    Years since quitting: 14.1   Smokeless tobacco: Never  Vaping Use   Vaping Use: Never used  Substance and Sexual Activity   Alcohol use: Yes    Alcohol/week: 2.0 standard drinks    Types: 2 Cans of beer per week    Comment: 3 times per week   Drug use: No   Sexual activity: Yes    Partners: Female  Other Topics Concern   Not on file  Social History Narrative   Not on file   Social Determinants of Health   Financial Resource Strain: Not on file  Food Insecurity: Not on file  Transportation Needs: Not on file  Physical Activity: Not on file  Stress: Not on file  Social Connections: Not on file  Intimate Partner Violence: Not on file    Outpatient Medications Prior to  Visit  Medication Sig Dispense Refill   simvastatin (ZOCOR) 40 MG tablet TAKE 1 TABLET BY MOUTH EVERY DAY 30 tablet 6   valsartan-hydrochlorothiazide (DIOVAN-HCT) 80-12.5 MG tablet TAKE 1 TABLET BY MOUTH DAILY 90 tablet 2   No facility-administered medications prior to visit.    No Known Allergies  Review of Systems  Constitutional:  Positive for fatigue. Negative for activity change and appetite change.  HENT:  Positive for congestion.   Eyes:  Negative for visual disturbance.  Respiratory:  Positive for cough. Negative for chest tightness.   Cardiovascular:  Negative for chest pain and palpitations.  Gastrointestinal:  Negative for abdominal distention and abdominal pain.  Endocrine: Negative for polyuria.  Genitourinary:  Negative for difficulty urinating.  Musculoskeletal:  Negative for arthralgias and back pain.  Psychiatric/Behavioral: Negative.        Objective:    Physical Exam Vitals reviewed.  Constitutional:      General: He is in acute distress.     Appearance: He is well-developed.  HENT:     Head: Normocephalic.     Right Ear: Tympanic membrane normal.     Left Ear: Tympanic membrane normal.     Nose: Congestion present.     Mouth/Throat:     Mouth: Mucous membranes are moist.  Eyes:     Extraocular Movements: Extraocular movements intact.     Conjunctiva/sclera: Conjunctivae normal.     Pupils: Pupils are equal, round, and reactive to light.  Cardiovascular:     Rate and Rhythm: Normal rate and regular rhythm.     Pulses: Normal pulses.     Heart sounds: No murmur heard.   No gallop.  Pulmonary:     Effort: Pulmonary effort is normal. No respiratory distress.  Abdominal:     General: Abdomen is flat. There is no distension.     Tenderness: There is no abdominal tenderness.  Musculoskeletal:        General: Normal range of motion.  Skin:    General: Skin is warm.     Capillary Refill: Capillary refill takes less than 2 seconds.  Neurological:      General: No focal deficit present.     Mental Status: He is alert. Mental status is at baseline.     Gait: Gait normal.    BP 120/84    Pulse 80    Temp 98 F (36.7 C)    Resp 15    Ht 6' (1.829 m)    Wt 256 lb (116.1 kg)    SpO2 95%    BMI 34.72 kg/m  Wt Readings from Last 3 Encounters:  06/16/21 256 lb (116.1 kg)  04/18/21 252 lb (114.3 kg)  10/29/20 246 lb (111.6 kg)    Health Maintenance Due  Topic Date Due   TETANUS/TDAP  Never done   Zoster Vaccines- Shingrix (1 of 2) Never done    There are no preventive care reminders to display for this patient.   No results found for: TSH Lab Results  Component Value Date   WBC 5.5 04/18/2021   HGB 16.2 04/18/2021   HCT 47.5 04/18/2021   MCV 95 04/18/2021   PLT 204 04/18/2021   Lab Results  Component Value Date   NA 141 04/18/2021   K 5.1 04/18/2021   CO2 25 04/18/2021   GLUCOSE 112 (H) 04/18/2021   BUN 14 04/18/2021   CREATININE 1.05 04/18/2021   BILITOT 0.3 04/18/2021   ALKPHOS 98 04/18/2021   AST 27 04/18/2021   ALT 43 04/18/2021   PROT 7.0 04/18/2021   ALBUMIN 4.7 04/18/2021   CALCIUM 9.6 04/18/2021   ANIONGAP 12 12/26/2013   EGFR 85 04/18/2021   Lab Results  Component Value Date   CHOL 182 04/18/2021   Lab Results  Component Value Date   HDL 37 (L) 04/18/2021   Lab Results  Component Value Date   LDLCALC 118 (H) 04/18/2021   Lab Results  Component Value Date   TRIG 151 (H) 04/18/2021   Lab Results  Component Value Date   CHOLHDL 4.9 04/18/2021   Lab Results  Component Value Date   HGBA1C 5.7 (H) 08/08/2019       Assessment & Plan:   Problem List Items Addressed This  Visit       Respiratory   Sinusitis   Relevant Medications   amoxicillin-clavulanate (AUGMENTIN) 875-125 MG tablet   predniSONE (STERAPRED UNI-PAK 21 TAB) 10 MG (21)  TBPK tablet Treat sinusitis with augmentin and pred pack   Other Visit Diagnoses     Sore throat    -  Primary   Relevant Orders   POC COVID-19-  negative   Influenza A/B - negative      Meds ordered this encounter  Medications   amoxicillin-clavulanate (AUGMENTIN) 875-125 MG tablet    Sig: Take 1 tablet by mouth 2 (two) times daily.    Dispense:  20 tablet    Refill:  0   predniSONE (STERAPRED UNI-PAK 21 TAB) 10 MG (21) TBPK tablet    Sig: Take 6ills first day , then 5 pills day 2 and then cut down one pill day until gone    Dispense:  21 tablet    Refill:  0    Orders Placed This Encounter  Procedures   POC COVID-19   Influenza A/B     Follow-up: Return if symptoms worsen or fail to improve.  An After Visit Summary was printed and given to the patient.  Reinaldo Meeker, MD [facility] [phone]

## 2021-07-14 DIAGNOSIS — M7712 Lateral epicondylitis, left elbow: Secondary | ICD-10-CM | POA: Diagnosis not present

## 2021-10-21 ENCOUNTER — Ambulatory Visit: Payer: BC Managed Care – PPO | Admitting: Legal Medicine

## 2021-11-06 NOTE — Progress Notes (Signed)
Subjective:  Patient ID: Jerome Olson, male    DOB: June 15, 1970  Age: 51 y.o. MRN: [id number]  Chief Complaint  Patient presents with   Hyperlipidemia   Hypertension    HPI Caedin presents for chronic fu of Hyperlipidemia, HTN, and prediabetes. States he has chronic right foot pain. He has broken his right foot twice. States his job requires standing, climbing up and down stairs, and walking. States he has swelling and discomfort nightly. Onset was a few years ago.Treatment includes Ibuprofen and rest.  Lipid/Cholesterol, Follow-up  Last lipid panel Other pertinent labs  Lab Results  Component Value Date   CHOL 182 04/18/2021   HDL 37 (L) 04/18/2021   LDLCALC 118 (H) 04/18/2021   TRIG 151 (H) 04/18/2021   CHOLHDL 4.9 04/18/2021   Lab Results  Component Value Date   ALT 43 04/18/2021   AST 27 04/18/2021   PLT 204 04/18/2021     He was last seen for this 6 months ago.  Management includes simvastatin.  He reports excellent compliance with treatment. He is not having side effects.    Hypertension Follow Up: He was last seen for hypertension 6 months ago.  BP at that visit was 134/72. Management includes Diovan-HCT.  He reports excellent compliance with treatment. He is not having side effects.  He is following a Regular diet. He is not exercising, has a physically active job He does not smoke.  Use of agents associated with hypertension: none.    Pertinent labs: Lab Results  Component Value Date   CHOL 182 04/18/2021   HDL 37 (L) 04/18/2021   LDLCALC 118 (H) 04/18/2021   TRIG 151 (H) 04/18/2021   CHOLHDL 4.9 04/18/2021   Lab Results  Component Value Date   NA 141 04/18/2021   K 5.1 04/18/2021   CREATININE 1.05 04/18/2021   EGFR 85 04/18/2021   GFRNONAA 90 02/16/2020   GLUCOSE 112 (H) 04/18/2021     The 10-year ASCVD risk score (Arnett DK, et al., 2019) is: 5.2%   Prediabetes, Follow-up  Lab Results  Component Value Date   HGBA1C 5.7 (H)  08/08/2019   GLUCOSE 112 (H) 04/18/2021   GLUCOSE 114 (H) 08/19/2020   GLUCOSE 110 (H) 02/16/2020    Last seen for for this6 months ago.  Management since that visit includes diet modification. Current symptoms include none and have been stable.  Prior visit with dietician: no Current diet: well balanced Current exercise: yard work, physically active job  Pertinent Labs:    Component Value Date/Time   CHOL 182 04/18/2021 0812   TRIG 151 (H) 04/18/2021 0812   CHOLHDL 4.9 04/18/2021 0812   CREATININE 1.05 04/18/2021 0812    Wt Readings from Last 3 Encounters:  11/07/21 249 lb (112.9 kg)  06/16/21 256 lb (116.1 kg)  04/18/21 252 lb (114.3 kg)     Current Outpatient Medications on File Prior to Visit  Medication Sig Dispense Refill   simvastatin (ZOCOR) 40 MG tablet TAKE 1 TABLET BY MOUTH EVERY DAY 30 tablet 6   valsartan-hydrochlorothiazide (DIOVAN-HCT) 80-12.5 MG tablet TAKE 1 TABLET BY MOUTH DAILY 90 tablet 2   No current facility-administered medications on file prior to visit.   No past medical history on file. Past Surgical History:  Procedure Laterality Date   Cyst Removed From Right Knee  90's   LUMBAR LAMINECTOMY/DECOMPRESSION MICRODISCECTOMY N/A 12/25/2013   Procedure: MICRO LUMBAR DECOMPRESSION L2-L3 central;  Surgeon: Johnn Hai, MD;  Location: [facility];  Service:  Orthopedics;  Laterality: N/A;    Family History  Problem Relation Age of Onset   Prostate cancer Father    Heart Problems Father    Social History   Socioeconomic History   Marital status: Married    Spouse name: Not on file   Number of children: 1   Years of education: Not on file   Highest education level: Not on file  Occupational History   Not on file  Tobacco Use   Smoking status: Former    Types: Cigarettes    Quit date: 2009    Years since quitting: 14.4   Smokeless tobacco: Never  Vaping Use   Vaping Use: Never used  Substance and Sexual Activity   Alcohol use: Yes     Alcohol/week: 2.0 standard drinks of alcohol    Types: 2 Cans of beer per week    Comment: 3 times per week   Drug use: No   Sexual activity: Yes    Partners: Female  Other Topics Concern   Not on file  Social History Narrative   Not on file   Social Determinants of Health   Financial Resource Strain: Not on file  Food Insecurity: Not on file  Transportation Needs: Not on file  Physical Activity: Not on file  Stress: Not on file  Social Connections: Not on file    Review of Systems  Constitutional:  Negative for chills, diaphoresis, fatigue and fever.  HENT:  Negative for congestion, ear pain and sore throat.   Respiratory:  Negative for cough and shortness of breath.   Cardiovascular:  Negative for chest pain and leg swelling.  Gastrointestinal:  Negative for abdominal pain, constipation, diarrhea, nausea and vomiting.  Genitourinary:  Negative for dysuria and urgency.  Musculoskeletal:  Negative for arthralgias and myalgias.  Neurological:  Negative for dizziness and headaches.  Psychiatric/Behavioral:  Negative for dysphoric mood. The patient is not nervous/anxious.      Objective:  BP 106/78   Pulse 72   Temp 98.4 F (36.9 C)   Resp 16   Ht 6' (1.829 m)   Wt 249 lb (112.9 kg)   BMI 33.77 kg/m       06/16/2021    4:17 PM 04/18/2021    7:45 AM 10/29/2020    8:06 AM  BP/Weight  Systolic BP 237 628 315  Diastolic BP 84 72 66  Wt. (Lbs) 256 252 246  BMI 34.72 kg/m2 34.18 kg/m2 33.36 kg/m2    Physical Exam Constitutional:      Appearance: Normal appearance.  Cardiovascular:     Rate and Rhythm: Normal rate and regular rhythm.     Pulses: Normal pulses.     Heart sounds: Normal heart sounds.  Pulmonary:     Effort: Pulmonary effort is normal.     Breath sounds: Normal breath sounds.  Abdominal:     General: Bowel sounds are normal.     Palpations: Abdomen is soft. There is no mass.     Tenderness: There is no abdominal tenderness.  Skin:    Capillary  Refill: Capillary refill takes less than 2 seconds.  Neurological:     Mental Status: He is alert and oriented to person, place, and time.  Psychiatric:        Mood and Affect: Mood normal.        Behavior: Behavior normal.         Lab Results  Component Value Date   WBC 5.5 04/18/2021   HGB 16.2  04/18/2021   HCT 47.5 04/18/2021   PLT 204 04/18/2021   GLUCOSE 112 (H) 04/18/2021   CHOL 182 04/18/2021   TRIG 151 (H) 04/18/2021   HDL 37 (L) 04/18/2021   LDLCALC 118 (H) 04/18/2021   ALT 43 04/18/2021   AST 27 04/18/2021   NA 141 04/18/2021   K 5.1 04/18/2021   CL 102 04/18/2021   CREATININE 1.05 04/18/2021   BUN 14 04/18/2021   CO2 25 04/18/2021   HGBA1C 5.7 (H) 08/08/2019      Assessment & Plan:   1. Mixed hyperlipidemia-not at goal - Lipid panel -continue Zocor as prescribed -decrease concentrated sweets and alcohol in diet  2. Essential hypertension, benign-well controlled - CBC with Differential/Platelet - Comprehensive metabolic panel -continue Diovan-HCTZ  3. Prediabetes - Hemoglobin A1c -pt's twin sister is diabetic -heart healthy diet  4. Encounter for screening for malignant neoplasm of prostate - PSA  5. Encounter for screening for colorectal malignant neoplasm - Cologuard  6. Right foot pain - meloxicam (MOBIC) 7.5 MG tablet; Take 1 tablet (7.5 mg total) by mouth daily.  Dispense: 90 tablet; Refill: 1  7. Family history of prostate cancer in father - PSA    Continue medication We will call you with lab results Consider Shingles and tetanus vaccines Begin Meloxicam 7.5 mg daily (with food) for foot pain, may increase to 2 tablets (15 mg) if needed Complete Cologuard screening  Follow-up in 87-month, fasting     Follow-up: 650-month fasting  An After Visit Summary was printed and given to the patient.  I, ShRip HarbourNP, have reviewed all documentation for this visit. The documentation on 11/07/21 for the exam, diagnosis,  procedures, and orders are all accurate and complete.   Signed, ShRip HarbourNP [phone]

## 2021-11-07 ENCOUNTER — Encounter: Payer: Self-pay | Admitting: Nurse Practitioner

## 2021-11-07 ENCOUNTER — Ambulatory Visit (INDEPENDENT_AMBULATORY_CARE_PROVIDER_SITE_OTHER): Payer: BC Managed Care – PPO | Admitting: Nurse Practitioner

## 2021-11-07 VITALS — BP 106/78 | HR 72 | Temp 98.4°F | Resp 16 | Ht 72.0 in | Wt 249.0 lb

## 2021-11-07 DIAGNOSIS — I10 Essential (primary) hypertension: Secondary | ICD-10-CM

## 2021-11-07 DIAGNOSIS — Z1211 Encounter for screening for malignant neoplasm of colon: Secondary | ICD-10-CM

## 2021-11-07 DIAGNOSIS — E782 Mixed hyperlipidemia: Secondary | ICD-10-CM

## 2021-11-07 DIAGNOSIS — R7303 Prediabetes: Secondary | ICD-10-CM

## 2021-11-07 DIAGNOSIS — M79671 Pain in right foot: Secondary | ICD-10-CM

## 2021-11-07 DIAGNOSIS — Z125 Encounter for screening for malignant neoplasm of prostate: Secondary | ICD-10-CM

## 2021-11-07 DIAGNOSIS — Z1212 Encounter for screening for malignant neoplasm of rectum: Secondary | ICD-10-CM

## 2021-11-07 DIAGNOSIS — Z8042 Family history of malignant neoplasm of prostate: Secondary | ICD-10-CM

## 2021-11-07 MED ORDER — MELOXICAM 7.5 MG PO TABS: 7.5000 mg | ORAL_TABLET | Freq: Every day | ORAL | 1 refills | Status: DC

## 2021-11-07 NOTE — Patient Instructions (Signed)
Continue medication We will call you with lab results Consider Shingles and tetanus vaccines Begin Meloxicam 7.5 mg daily (with food) for foot pain, may increase to 2 tablets (15 mg) if needed Complete Cologuard screening  Follow-up in 1-year  Health Maintenance, Male Adopting a healthy lifestyle and getting preventive care are important in promoting health and wellness. Ask your health care provider about: The right schedule for you to have regular tests and exams. Things you can do on your own to prevent diseases and keep yourself healthy. What should I know about diet, weight, and exercise? Eat a healthy diet  Eat a diet that includes plenty of vegetables, fruits, low-fat dairy products, and lean protein. Do not eat a lot of foods that are high in solid fats, added sugars, or sodium. Maintain a healthy weight Body mass index (BMI) is a measurement that can be used to identify possible weight problems. It estimates body fat based on height and weight. Your health care provider can help determine your BMI and help you achieve or maintain a healthy weight. Get regular exercise Get regular exercise. This is one of the most important things you can do for your health. Most adults should: Exercise for at least 150 minutes each week. The exercise should increase your heart rate and make you sweat (moderate-intensity exercise). Do strengthening exercises at least twice a week. This is in addition to the moderate-intensity exercise. Spend less time sitting. Even light physical activity can be beneficial. Watch cholesterol and blood lipids Have your blood tested for lipids and cholesterol at 53 years of age, then have this test every 5 years. You may need to have your cholesterol levels checked more often if: Your lipid or cholesterol levels are high. You are older than 53 years of age. You are at high risk for heart disease. What should I know about cancer screening? Many types of cancers can  be detected early and may often be prevented. Depending on your health history and family history, you may need to have cancer screening at various ages. This may include screening for: Colorectal cancer. Prostate cancer. Skin cancer. Lung cancer. What should I know about heart disease, diabetes, and high blood pressure? Blood pressure and heart disease High blood pressure causes heart disease and increases the risk of stroke. This is more likely to develop in people who have high blood pressure readings or are overweight. Talk with your health care provider about your target blood pressure readings. Have your blood pressure checked: Every 3-5 years if you are 68-85 years of age. Every year if you are 97 years old or older. If you are between the ages of 56 and 89 and are a current or former smoker, ask your health care provider if you should have a one-time screening for abdominal aortic aneurysm (AAA). Diabetes Have regular diabetes screenings. This checks your fasting blood sugar level. Have the screening done: Once every three years after age 96 if you are at a normal weight and have a low risk for diabetes. More often and at a younger age if you are overweight or have a high risk for diabetes. What should I know about preventing infection? Hepatitis B If you have a higher risk for hepatitis B, you should be screened for this virus. Talk with your health care provider to find out if you are at risk for hepatitis B infection. Hepatitis C Blood testing is recommended for: Everyone born from [birth year] through 1965. Anyone with known risk factors for  hepatitis C. Sexually transmitted infections (STIs) You should be screened each year for STIs, including gonorrhea and chlamydia, if: You are sexually active and are younger than 53 years of age. You are older than 53 years of age and your health care provider tells you that you are at risk for this type of infection. Your sexual activity has  changed since you were last screened, and you are at increased risk for chlamydia or gonorrhea. Ask your health care provider if you are at risk. Ask your health care provider about whether you are at high risk for HIV. Your health care provider may recommend a prescription medicine to help prevent HIV infection. If you choose to take medicine to prevent HIV, you should first get tested for HIV. You should then be tested every 3 months for as long as you are taking the medicine. Follow these instructions at home: Alcohol use Do not drink alcohol if your health care provider tells you not to drink. If you drink alcohol: Limit how much you have to 0-2 drinks a day. Know how much alcohol is in your drink. In the [place] one drink equals one 12 oz bottle of beer (355 mL), one 5 oz glass of wine (148 mL), or one 1 oz glass of hard liquor (44 mL). Lifestyle Do not use any products that contain nicotine or tobacco. These products include cigarettes, chewing tobacco, and vaping devices, such as e-cigarettes. If you need help quitting, ask your health care provider. Do not use street drugs. Do not share needles. Ask your health care provider for help if you need support or information about quitting drugs. General instructions Schedule regular health, dental, and eye exams. Stay current with your vaccines. Tell your health care provider if: You often feel depressed. You have ever been abused or do not feel safe at home. Summary Adopting a healthy lifestyle and getting preventive care are important in promoting health and wellness. Follow your health care provider's instructions about healthy diet, exercising, and getting tested or screened for diseases. Follow your health care provider's instructions on monitoring your cholesterol and blood pressure. This information is not intended to replace advice given to you by your health care provider. Make sure you discuss any questions you have with your health  care provider. Document Revised: 09/16/2020 Document Reviewed: 09/16/2020 Elsevier Patient Education  2023 Elsevier Inc.    Foot Pain Many things can cause foot pain. Some common causes are: An injury. A sprain. Arthritis. Blisters. Bunions. Follow these instructions at home: Managing pain, stiffness, and swelling If directed, put ice on the painful area: Put ice in a plastic bag. Place a towel between your skin and the bag. Leave the ice on for 20 minutes, 2-3 times a day.  Activity Do not stand or walk for long periods. Return to your normal activities as told by your health care provider. Ask your health care provider what activities are safe for you. Do stretches to relieve foot pain and stiffness as told by your health care provider. Do not lift anything that is heavier than 10 lb (4.5 kg), or the limit that you are told, until your health care provider says that it is safe. Lifting a lot of weight can put added pressure on your feet. Lifestyle Wear comfortable, supportive shoes that fit you well. Do not wear high heels. Keep your feet clean and dry. General instructions Take over-the-counter and prescription medicines only as told by your health care provider. Rub your foot gently.  Pay attention to any changes in your symptoms. Keep all follow-up visits as told by your health care provider. This is important. Contact a health care provider if: Your pain does not get better after a few days of self-care. Your pain gets worse. You cannot stand on your foot. Get help right away if: Your foot is numb or tingling. Your foot or toes are swollen. Your foot or toes turn white or blue. You have warmth and redness along your foot. Summary Common causes of foot pain are injury, sprain, arthritis, blisters, or bunions. Ice, medicines, and comfortable shoes may help foot pain. Contact your health care provider if your pain does not get better after a few days of  self-care. This information is not intended to replace advice given to you by your health care provider. Make sure you discuss any questions you have with your health care provider. Document Revised: 07/31/2020 Document Reviewed: 07/31/2020 Elsevier Patient Education  2023 [employer].

## 2021-11-08 LAB — LIPID PANEL
Chol/HDL Ratio: 4.6 ratio (ref 0.0–5.0)
Cholesterol, Total: 166 mg/dL (ref 100–199)
HDL: 36 mg/dL — ABNORMAL LOW (ref >39)
LDL Chol Calc (NIH): 90 mg/dL (ref 0–99)
Triglycerides: 234 mg/dL — ABNORMAL HIGH (ref 0–149)
VLDL Cholesterol Cal: 40 mg/dL (ref 5–40)

## 2021-11-08 LAB — CARDIOVASCULAR RISK ASSESSMENT

## 2021-11-08 LAB — CBC WITH DIFFERENTIAL/PLATELET
Basophils Absolute: 0.1 10*3/uL (ref 0.0–0.2)
Basos: 1 %
EOS (ABSOLUTE): 0.1 10*3/uL (ref 0.0–0.4)
Eos: 2 %
Hematocrit: 44.7 % (ref 37.5–51.0)
Hemoglobin: 15.4 g/dL (ref 13.0–17.7)
Immature Grans (Abs): 0 10*3/uL (ref 0.0–0.1)
Immature Granulocytes: 0 %
Lymphocytes Absolute: 1.7 10*3/uL (ref 0.7–3.1)
Lymphs: 37 %
MCH: 32.5 pg (ref 26.6–33.0)
MCHC: 34.5 g/dL (ref 31.5–35.7)
MCV: 94 fL (ref 79–97)
Monocytes Absolute: 0.4 10*3/uL (ref 0.1–0.9)
Monocytes: 8 %
Neutrophils Absolute: 2.4 10*3/uL (ref 1.4–7.0)
Neutrophils: 52 %
Platelets: 227 10*3/uL (ref 150–450)
RBC: 4.74 x10E6/uL (ref 4.14–5.80)
RDW: 12.2 % (ref 11.6–15.4)
WBC: 4.6 10*3/uL (ref 3.4–10.8)

## 2021-11-08 LAB — COMPREHENSIVE METABOLIC PANEL
ALT: 36 IU/L (ref 0–44)
AST: 30 IU/L (ref 0–40)
Albumin/Globulin Ratio: 2.2 (ref 1.2–2.2)
Albumin: 4.6 g/dL (ref 3.8–4.9)
Alkaline Phosphatase: 79 IU/L (ref 44–121)
BUN/Creatinine Ratio: 16 (ref 9–20)
BUN: 16 mg/dL (ref 6–24)
Bilirubin Total: 0.6 mg/dL (ref 0.0–1.2)
CO2: 22 mmol/L (ref 20–29)
Calcium: 9.6 mg/dL (ref 8.7–10.2)
Chloride: 104 mmol/L (ref 96–106)
Creatinine, Ser: 1 mg/dL (ref 0.76–1.27)
Globulin, Total: 2.1 g/dL (ref 1.5–4.5)
Glucose: 112 mg/dL — ABNORMAL HIGH (ref 70–99)
Potassium: 5 mmol/L (ref 3.5–5.2)
Sodium: 143 mmol/L (ref 134–144)
Total Protein: 6.7 g/dL (ref 6.0–8.5)
eGFR: 91 mL/min/{1.73_m2} (ref >59)

## 2021-11-08 LAB — HEMOGLOBIN A1C
Est. average glucose Bld gHb Est-mCnc: 114 mg/dL
Hgb A1c MFr Bld: 5.6 % (ref 4.8–5.6)

## 2021-11-08 LAB — PSA: Prostate Specific Ag, Serum: 0.6 ng/mL (ref 0.0–4.0)

## 2021-11-25 DIAGNOSIS — Z1211 Encounter for screening for malignant neoplasm of colon: Secondary | ICD-10-CM | POA: Diagnosis not present

## 2021-11-25 DIAGNOSIS — Z1212 Encounter for screening for malignant neoplasm of rectum: Secondary | ICD-10-CM | POA: Diagnosis not present

## 2021-12-03 LAB — COLOGUARD: COLOGUARD: NEGATIVE

## 2021-12-16 ENCOUNTER — Other Ambulatory Visit: Payer: Self-pay | Admitting: Legal Medicine

## 2021-12-26 DIAGNOSIS — M7712 Lateral epicondylitis, left elbow: Secondary | ICD-10-CM | POA: Diagnosis not present

## 2021-12-30 ENCOUNTER — Telehealth: Payer: Self-pay | Admitting: Cardiology

## 2021-12-30 NOTE — Telephone Encounter (Signed)
error 

## 2022-01-13 ENCOUNTER — Other Ambulatory Visit: Payer: Self-pay | Admitting: Legal Medicine

## 2022-01-13 DIAGNOSIS — I10 Essential (primary) hypertension: Secondary | ICD-10-CM

## 2022-03-16 ENCOUNTER — Other Ambulatory Visit: Payer: Self-pay | Admitting: Nurse Practitioner

## 2022-03-16 DIAGNOSIS — M79671 Pain in right foot: Secondary | ICD-10-CM

## 2022-05-05 NOTE — Progress Notes (Unsigned)
Subjective:  Patient ID: Jerome Olson, male    DOB: [birth year]  Age: 51 y.o. MRN: [id number]  CC: HTN Hyperlipidemia Prediabetes  HPI   Pt presents for f/u of chronic medical problems of HTN, HLD, and prediabetes. He tells me he and his spouse became sick with flu 6 days ago. Reports symptoms of fatigue and sinus congestion. Jerome Olson also reports some intermittent difficulty swallowing foods while eating and GERD. States his father has undergone esophageal dilation a few times in the past. Denies previous GI consult. He is not currently prescribed medication for GERD.    Hypertension, follow-up He was last seen for hypertension 6 months ago.  BP at that visit was 106/78. Management includes Diovan-HCTZ.  He reports good compliance with treatment. He is not having side effects.  He is following a Regular diet. He is exercising, has physically active job He does not smoke.  Use of agents associated with hypertension: NSAIDS.  Outside blood pressures are .  Pertinent labs: Lab Results  Component Value Date   CHOL 166 11/07/2021   HDL 36 (L) 11/07/2021   LDLCALC 90 11/07/2021   TRIG 234 (H) 11/07/2021   CHOLHDL 4.6 11/07/2021   Lab Results  Component Value Date   NA 143 11/07/2021   K 5.0 11/07/2021   CREATININE 1.00 11/07/2021   EGFR 91 11/07/2021   GFRNONAA 90 02/16/2020   GLUCOSE 112 (H) 11/07/2021     The 10-year ASCVD risk score (Arnett DK, et al., 2019) is: 4.2%    Lipid/Cholesterol, Follow-up  Last lipid panel Other pertinent labs  Lab Results  Component Value Date   CHOL 166 11/07/2021   HDL 36 (L) 11/07/2021   LDLCALC 90 11/07/2021   TRIG 234 (H) 11/07/2021   CHOLHDL 4.6 11/07/2021   Lab Results  Component Value Date   ALT 36 11/07/2021   AST 30 11/07/2021   PLT 227 11/07/2021     He was last seen for this 6 months ago.  Management includes Zocor 40 mg QD. He reports good compliance with treatment. He is not having side effects.  Current diet:  in general, a "healthy" diet   Current exercise: walking  The 10-year ASCVD risk score (Arnett DK, et al., 2019) is: 4.2%   Prediabetes, Follow-up  Lab Results  Component Value Date   HGBA1C 5.6 11/07/2021   HGBA1C 5.7 (H) 08/08/2019   GLUCOSE 112 (H) 11/07/2021   GLUCOSE 112 (H) 04/18/2021   GLUCOSE 114 (H) 08/19/2020    Last seen for for this 6 months ago.  Management since that visit includes diet modifications. Current symptoms include none and have been stable. Prior visit with dietician: no Current diet: in general, a "healthy" diet   Current exercise: walking  Pertinent Labs:    Component Value Date/Time   CHOL 166 11/07/2021 0754   TRIG 234 (H) 11/07/2021 0754   CHOLHDL 4.6 11/07/2021 0754   CREATININE 1.00 11/07/2021 0754    Wt Readings from Last 3 Encounters:  11/07/21 249 lb (112.9 kg)  06/16/21 256 lb (116.1 kg)  04/18/21 252 lb (114.3 kg)     Current Outpatient Medications on File Prior to Visit  Medication Sig Dispense Refill   meloxicam (MOBIC) 7.5 MG tablet TAKE 1 TABLET(7.5 MG) BY MOUTH DAILY 90 tablet 1   simvastatin (ZOCOR) 40 MG tablet TAKE 1 TABLET BY MOUTH EVERY DAY 30 tablet 6   valsartan-hydrochlorothiazide (DIOVAN-HCT) 80-12.5 MG tablet TAKE 1 TABLET BY MOUTH DAILY 90 tablet 2  No current facility-administered medications on file prior to visit.   No past medical history on file. Past Surgical History:  Procedure Laterality Date   Cyst Removed From Right Knee  90's   LUMBAR LAMINECTOMY/DECOMPRESSION MICRODISCECTOMY N/A 12/25/2013   Procedure: MICRO LUMBAR DECOMPRESSION L2-L3 central;  Surgeon: Johnn Hai, MD;  Location: [facility];  Service: Orthopedics;  Laterality: N/A;    Family History  Problem Relation Age of Onset   Prostate cancer Father    Heart Problems Father    Social History   Socioeconomic History   Marital status: Married    Spouse name: Not on file   Number of children: 1   Years of education: Not on file    Highest education level: Not on file  Occupational History   Not on file  Tobacco Use   Smoking status: Former    Types: Cigarettes    Quit date: 2009    Years since quitting: 14.9   Smokeless tobacco: Never  Vaping Use   Vaping Use: Never used  Substance and Sexual Activity   Alcohol use: Yes    Alcohol/week: 2.0 standard drinks of alcohol    Types: 2 Cans of beer per week    Comment: 3 times per week   Drug use: No   Sexual activity: Yes    Partners: Female  Other Topics Concern   Not on file  Social History Narrative   Not on file   Social Determinants of Health   Financial Resource Strain: Not on file  Food Insecurity: Not on file  Transportation Needs: Not on file  Physical Activity: Not on file  Stress: Not on file  Social Connections: Not on file    Review of Systems  Constitutional:  Positive for fatigue.  HENT:  Positive for trouble swallowing (intermittent). Negative for congestion, ear pain and sore throat.   Respiratory:  Negative for cough and shortness of breath.   Cardiovascular:  Negative for chest pain.  Gastrointestinal:  Negative for abdominal pain, constipation, diarrhea, nausea and vomiting.  Genitourinary:  Negative for dysuria, frequency and urgency.  Musculoskeletal:  Positive for arthralgias (chronic multiple joint pain). Negative for back pain and myalgias.  Skin:        Right thigh skin lesion  Neurological:  Negative for dizziness and headaches.  Psychiatric/Behavioral:  Negative for agitation and sleep disturbance. The patient is not nervous/anxious.      Objective:  BP 122/84 (BP Location: Right Arm, Patient Position: Sitting, Cuff Size: Large)   Pulse 68   Temp (!) 97 F (36.1 C) (Temporal)   Ht 6' (1.829 m)   Wt 237 lb 6.4 oz (107.7 kg)   SpO2 97%   BMI 32.20 kg/m       11/07/2021    7:27 AM 06/16/2021    4:17 PM 04/18/2021    7:45 AM  BP/Weight  Systolic BP 203 559 741  Diastolic BP 78 84 72  Wt. (Lbs) 249 256 252  BMI  33.77 kg/m2 34.72 kg/m2 34.18 kg/m2    Physical Exam Constitutional:      Appearance: Normal appearance.  HENT:     Right Ear: Tympanic membrane normal.     Left Ear: Tympanic membrane normal.     Nose: Congestion present.     Mouth/Throat:     Pharynx: Posterior oropharyngeal erythema present.  Neck:     Vascular: No carotid bruit.  Cardiovascular:     Rate and Rhythm: Normal rate and regular rhythm.  Pulses: Normal pulses.     Heart sounds: Normal heart sounds. No murmur heard. Pulmonary:     Effort: Pulmonary effort is normal.     Breath sounds: Normal breath sounds.  Abdominal:     General: Bowel sounds are normal.     Palpations: Abdomen is soft. There is no mass.     Tenderness: There is no abdominal tenderness.  Musculoskeletal:        General: No swelling.  Skin:    General: Skin is warm.     Capillary Refill: Capillary refill takes less than 2 seconds.  Neurological:     General: No focal deficit present.     Mental Status: He is alert and oriented to person, place, and time.  Psychiatric:        Mood and Affect: Mood normal.        Behavior: Behavior normal.     Lab Results  Component Value Date   WBC 4.6 11/07/2021   HGB 15.4 11/07/2021   HCT 44.7 11/07/2021   PLT 227 11/07/2021   GLUCOSE 112 (H) 11/07/2021   CHOL 166 11/07/2021   TRIG 234 (H) 11/07/2021   HDL 36 (L) 11/07/2021   LDLCALC 90 11/07/2021   ALT 36 11/07/2021   AST 30 11/07/2021   NA 143 11/07/2021   K 5.0 11/07/2021   CL 104 11/07/2021   CREATININE 1.00 11/07/2021   BUN 16 11/07/2021   CO2 22 11/07/2021   HGBA1C 5.6 11/07/2021      Assessment & Plan:    1. Mixed hyperlipidemia-well controlled - Comprehensive metabolic panel - CBC with Differential/Platelet - Lipid panel -continue Zocor 40 mg QD  2. Essential hypertension, benign-well controlled - Comprehensive metabolic panel - CBC with Differential/Platelet -continue Diovan-HCTZ 80-12.5 mg QD  3. Prediabetes-well  controlled - Comprehensive metabolic panel - CBC with Differential/Platelet -continue heart healthy low-carb diet    We will call you with lab results Continue medications Consider dermatology for right leg skin lesion Notify office if difficulty swallowing worsens Follow-up in 58-month, fasting  Follow-up: 664-month fasting  An After Visit Summary was printed and given to the patient.  I, ShRip HarbourNP, have reviewed all documentation for this visit. The documentation on 05/06/22 for the exam, diagnosis, procedures, and orders are all accurate and complete.   Signed, ShRip HarbourNP [phone]

## 2022-05-06 ENCOUNTER — Encounter: Payer: Self-pay | Admitting: Nurse Practitioner

## 2022-05-06 ENCOUNTER — Ambulatory Visit: Payer: BC Managed Care – PPO | Admitting: Nurse Practitioner

## 2022-05-06 VITALS — BP 122/84 | HR 68 | Temp 97.0°F | Ht 72.0 in | Wt 237.4 lb

## 2022-05-06 DIAGNOSIS — I10 Essential (primary) hypertension: Secondary | ICD-10-CM | POA: Diagnosis not present

## 2022-05-06 DIAGNOSIS — R7303 Prediabetes: Secondary | ICD-10-CM

## 2022-05-06 DIAGNOSIS — R7309 Other abnormal glucose: Secondary | ICD-10-CM | POA: Diagnosis not present

## 2022-05-06 DIAGNOSIS — E782 Mixed hyperlipidemia: Secondary | ICD-10-CM

## 2022-05-06 NOTE — Patient Instructions (Addendum)
We will call you with lab results Continue medications Consider dermatology for right leg skin lesion Notify office if difficulty swallowing worsens Follow-up in 67-months, fasting  Food Choices for Gastroesophageal Reflux Disease, Adult When you have gastroesophageal reflux disease (GERD), the foods you eat and your eating habits are very important. Choosing the right foods can help ease your discomfort. Think about working with a food expert (dietitian) to help you make good choices. What are tips for following this plan? Reading food labels Look for foods that are low in saturated fat. Foods that may help with your symptoms include: Foods that have less than 5% of daily value (DV) of fat. Foods that have 0 grams of trans fat. Cooking Do not fry your food. Cook your food by baking, steaming, grilling, or broiling. These are all methods that do not need a lot of fat for cooking. To add flavor, try to use herbs that are low in spice and acidity. Meal planning  Choose healthy foods that are low in fat, such as: Fruits and vegetables. Whole grains. Low-fat dairy products. Lean meats, fish, and poultry. Eat small meals often instead of eating 3 large meals each day. Eat your meals slowly in a place where you are relaxed. Avoid bending over or lying down until 2-3 hours after eating. Limit high-fat foods such as fatty meats or fried foods. Limit your intake of fatty foods, such as oils, butter, and shortening. Avoid the following as told by your doctor: Foods that cause symptoms. These may be different for different people. Keep a food diary to keep track of foods that cause symptoms. Alcohol. Drinking a lot of liquid with meals. Eating meals during the 2-3 hours before bed. Lifestyle Stay at a healthy weight. Ask your doctor what weight is healthy for you. If you need to lose weight, work with your doctor to do so safely. Exercise for at least 30 minutes on 5 or more days each week, or  as told by your doctor. Wear loose-fitting clothes. Do not smoke or use any products that contain nicotine or tobacco. If you need help quitting, ask your doctor. Sleep with the head of your bed higher than your feet. Use a wedge under the mattress or blocks under the bed frame to raise the head of the bed. Chew sugar-free gum after meals. What foods should eat?  Eat a healthy, well-balanced diet of fruits, vegetables, whole grains, low-fat dairy products, lean meats, fish, and poultry. Each person is different. Foods that may cause symptoms in one person may not cause any symptoms in another person. Work with your doctor to find foods that are safe for you. The items listed above may not be a complete list of what you can eat and drink. Contact a food expert for more options. What foods should I avoid? Limiting some of these foods may help in managing the symptoms of GERD. Everyone is different. Talk with a food expert or your doctor to help you find the exact foods to avoid, if any. Fruits Any fruits prepared with added fat. Any fruits that cause symptoms. For some people, this may include citrus fruits, such as oranges, grapefruit, pineapple, and lemons. Vegetables Deep-fried vegetables. [place] fries. Any vegetables prepared with added fat. Any vegetables that cause symptoms. For some people, this may include tomatoes and tomato products, chili peppers, onions and garlic, and horseradish. Grains Pastries or quick breads with added fat. Meats and other proteins High-fat meats, such as fatty beef or pork,  hot dogs, ribs, ham, sausage, salami, and bacon. Fried meat or protein, including fried fish and fried chicken. Nuts and nut butters, in large amounts. Dairy Whole milk and chocolate milk. Sour cream. Cream. Ice cream. Cream cheese. Milkshakes. Fats and oils Butter. Margarine. Shortening. Ghee. Beverages Coffee and tea, with or without caffeine. Carbonated beverages. Sodas. Energy drinks.  Fruit juice made with acidic fruits, such as orange or grapefruit. Tomato juice. Alcoholic drinks. Sweets and desserts Chocolate and cocoa. Donuts. Seasonings and condiments Pepper. Peppermint and spearmint. Added salt. Any condiments, herbs, or seasonings that cause symptoms. For some people, this may include curry, hot sauce, or vinegar-based salad dressings. The items listed above may not be a complete list of what you should not eat and drink. Contact a food expert for more options. Questions to ask your doctor Diet and lifestyle changes are often the first steps that are taken to manage symptoms of GERD. If diet and lifestyle changes do not help, talk with your doctor about taking medicines. Where to find more information International [employer] for Gastrointestinal Disorders: aboutgerd.org Summary When you have GERD, food and lifestyle choices are very important in easing your symptoms. Eat small meals often instead of 3 large meals a day. Eat your meals slowly and in a place where you are relaxed. Avoid bending over or lying down until 2-3 hours after eating. Limit high-fat foods such as fatty meats or fried foods. This information is not intended to replace advice given to you by your health care provider. Make sure you discuss any questions you have with your health care provider. Document Revised: 11/06/2019 Document Reviewed: 11/06/2019 Elsevier Patient Education  2023 [employer].

## 2022-05-07 LAB — COMPREHENSIVE METABOLIC PANEL
ALT: 28 IU/L (ref 0–44)
AST: 23 IU/L (ref 0–40)
Albumin/Globulin Ratio: 1.8 (ref 1.2–2.2)
Albumin: 4.3 g/dL (ref 3.8–4.9)
Alkaline Phosphatase: 77 IU/L (ref 44–121)
BUN/Creatinine Ratio: 16 (ref 9–20)
BUN: 15 mg/dL (ref 6–24)
Bilirubin Total: 0.6 mg/dL (ref 0.0–1.2)
CO2: 22 mmol/L (ref 20–29)
Calcium: 8.9 mg/dL (ref 8.7–10.2)
Chloride: 101 mmol/L (ref 96–106)
Creatinine, Ser: 0.91 mg/dL (ref 0.76–1.27)
Globulin, Total: 2.4 g/dL (ref 1.5–4.5)
Glucose: 105 mg/dL — ABNORMAL HIGH (ref 70–99)
Potassium: 3.8 mmol/L (ref 3.5–5.2)
Sodium: 139 mmol/L (ref 134–144)
Total Protein: 6.7 g/dL (ref 6.0–8.5)
eGFR: 101 mL/min/{1.73_m2} (ref >59)

## 2022-05-07 LAB — CARDIOVASCULAR RISK ASSESSMENT

## 2022-05-07 LAB — LIPID PANEL
Chol/HDL Ratio: 5.7 ratio — ABNORMAL HIGH (ref 0.0–5.0)
Cholesterol, Total: 155 mg/dL (ref 100–199)
HDL: 27 mg/dL — ABNORMAL LOW (ref >39)
LDL Chol Calc (NIH): 91 mg/dL (ref 0–99)
Triglycerides: 216 mg/dL — ABNORMAL HIGH (ref 0–149)
VLDL Cholesterol Cal: 37 mg/dL (ref 5–40)

## 2022-05-07 LAB — CBC WITH DIFFERENTIAL/PLATELET
Basophils Absolute: 0 10*3/uL (ref 0.0–0.2)
Basos: 1 %
EOS (ABSOLUTE): 0.1 10*3/uL (ref 0.0–0.4)
Eos: 2 %
Hematocrit: 44.9 % (ref 37.5–51.0)
Hemoglobin: 15.4 g/dL (ref 13.0–17.7)
Immature Grans (Abs): 0 10*3/uL (ref 0.0–0.1)
Immature Granulocytes: 0 %
Lymphocytes Absolute: 1.6 10*3/uL (ref 0.7–3.1)
Lymphs: 40 %
MCH: 32.3 pg (ref 26.6–33.0)
MCHC: 34.3 g/dL (ref 31.5–35.7)
MCV: 94 fL (ref 79–97)
Monocytes Absolute: 0.4 10*3/uL (ref 0.1–0.9)
Monocytes: 9 %
Neutrophils Absolute: 1.9 10*3/uL (ref 1.4–7.0)
Neutrophils: 48 %
Platelets: 164 10*3/uL (ref 150–450)
RBC: 4.77 x10E6/uL (ref 4.14–5.80)
RDW: 12 % (ref 11.6–15.4)
WBC: 4 10*3/uL (ref 3.4–10.8)

## 2022-05-08 ENCOUNTER — Other Ambulatory Visit: Payer: Self-pay

## 2022-05-08 DIAGNOSIS — R7309 Other abnormal glucose: Secondary | ICD-10-CM

## 2022-05-08 LAB — HEMOGLOBIN A1C
Est. average glucose Bld gHb Est-mCnc: 111 mg/dL
Hgb A1c MFr Bld: 5.5 % (ref 4.8–5.6)

## 2022-05-08 LAB — SPECIMEN STATUS REPORT

## 2022-07-24 DIAGNOSIS — G5781 Other specified mononeuropathies of right lower limb: Secondary | ICD-10-CM | POA: Diagnosis not present

## 2022-07-24 DIAGNOSIS — Q6671 Congenital pes cavus, right foot: Secondary | ICD-10-CM | POA: Diagnosis not present

## 2022-07-24 DIAGNOSIS — M722 Plantar fascial fibromatosis: Secondary | ICD-10-CM | POA: Diagnosis not present

## 2022-08-11 ENCOUNTER — Other Ambulatory Visit: Payer: Self-pay

## 2022-08-11 MED ORDER — SIMVASTATIN 40 MG PO TABS
40.0000 mg | ORAL_TABLET | Freq: Every day | ORAL | 2 refills | Status: AC
Start: 2022-08-11 — End: ?

## 2022-08-29 DIAGNOSIS — H8110 Benign paroxysmal vertigo, unspecified ear: Secondary | ICD-10-CM | POA: Diagnosis not present

## 2022-08-29 DIAGNOSIS — R42 Dizziness and giddiness: Secondary | ICD-10-CM | POA: Diagnosis not present

## 2022-08-31 ENCOUNTER — Telehealth: Payer: Self-pay

## 2022-08-31 NOTE — Telephone Encounter (Signed)
Per sally: reviewed ED note --- it said to follow up if he was still having symptoms --- if his dizziness has resolved then he can come in for his regular follow up --- he needs to go ahead and make appointment to establish with new provider for chronic fasting follow up --- can see Jerome Olson   I spoke with the patient who stated that he feels much better. No symptoms now. He stated that if the dizziness occurs again he will call to make an appointment or go back to the emergency department. The patient had an appointment with Jerome Farber, NP in June that was canceled due to her leaving. I have rescheduled that appointment with Jerome Olson, [place] in June on the same date/time as the original appointment.

## 2022-08-31 NOTE — Telephone Encounter (Signed)
Dr. Sedalia Muta or Kennon Rounds,   When and where can we schedule this patient?  Patients wife was notified that we do not have an opening within the timeframe that he needs to be seen and will follow-up with the provider for further advisement.  Hospital: [facility] emergency department  DOS: 08/29/2022 Follow-up within: 1 week-patient is requesting a Friday appointment due to being off of work. Diagnosed: vertigo

## 2022-09-18 DIAGNOSIS — Q6671 Congenital pes cavus, right foot: Secondary | ICD-10-CM | POA: Diagnosis not present

## 2022-09-18 DIAGNOSIS — G5781 Other specified mononeuropathies of right lower limb: Secondary | ICD-10-CM | POA: Diagnosis not present

## 2022-09-18 DIAGNOSIS — M722 Plantar fascial fibromatosis: Secondary | ICD-10-CM | POA: Diagnosis not present

## 2022-10-12 ENCOUNTER — Other Ambulatory Visit: Payer: Self-pay | Admitting: Family Medicine

## 2022-10-12 ENCOUNTER — Other Ambulatory Visit: Payer: Self-pay

## 2022-10-12 DIAGNOSIS — I10 Essential (primary) hypertension: Secondary | ICD-10-CM

## 2022-10-12 MED ORDER — VALSARTAN-HYDROCHLOROTHIAZIDE 80-12.5 MG PO TABS: 1.0000 | ORAL_TABLET | Freq: Every day | ORAL | 0 refills | Status: AC

## 2022-11-06 ENCOUNTER — Ambulatory Visit: Payer: BC Managed Care – PPO | Admitting: Physician Assistant

## 2022-11-06 ENCOUNTER — Ambulatory Visit: Payer: BC Managed Care – PPO | Admitting: Nurse Practitioner

## 2022-11-06 DIAGNOSIS — G5781 Other specified mononeuropathies of right lower limb: Secondary | ICD-10-CM | POA: Diagnosis not present

## 2022-11-20 DIAGNOSIS — Z125 Encounter for screening for malignant neoplasm of prostate: Secondary | ICD-10-CM | POA: Diagnosis not present

## 2022-11-20 DIAGNOSIS — E782 Mixed hyperlipidemia: Secondary | ICD-10-CM | POA: Diagnosis not present

## 2022-11-20 DIAGNOSIS — I10 Essential (primary) hypertension: Secondary | ICD-10-CM | POA: Diagnosis not present

## 2023-01-01 DIAGNOSIS — L851 Acquired keratosis [keratoderma] palmaris et plantaris: Secondary | ICD-10-CM | POA: Diagnosis not present

## 2023-01-01 DIAGNOSIS — G5781 Other specified mononeuropathies of right lower limb: Secondary | ICD-10-CM | POA: Diagnosis not present

## 2023-02-13 ENCOUNTER — Other Ambulatory Visit: Payer: Self-pay | Admitting: Family Medicine

## 2023-02-13 DIAGNOSIS — I10 Essential (primary) hypertension: Secondary | ICD-10-CM
